# Patient Record
Sex: Female | Born: 1950
Health system: Southern US, Community
[De-identification: ages and names within clinical notes are randomized; demographics above are authoritative.]

## PROBLEM LIST (undated history)

## (undated) DIAGNOSIS — C50919 Malignant neoplasm of unspecified site of unspecified female breast: Secondary | ICD-10-CM

## (undated) DIAGNOSIS — I10 Essential (primary) hypertension: Secondary | ICD-10-CM

## (undated) DIAGNOSIS — M1990 Unspecified osteoarthritis, unspecified site: Secondary | ICD-10-CM

## (undated) DIAGNOSIS — F32A Depression, unspecified: Secondary | ICD-10-CM

## (undated) HISTORY — DX: Essential (primary) hypertension: I10

## (undated) HISTORY — DX: Malignant neoplasm of unspecified site of unspecified female breast: C50.919

## (undated) HISTORY — PX: OTHER SURGICAL HISTORY: SHX169

## (undated) HISTORY — DX: Depression, unspecified: F32.A

## (undated) HISTORY — DX: Unspecified osteoarthritis, unspecified site: M19.90

---

## 1974-04-30 HISTORY — PX: OTHER SURGICAL HISTORY: SHX169

## 2000-03-29 ENCOUNTER — Emergency Department (HOSPITAL_COMMUNITY): Admission: EM | Admit: 2000-03-29 | Discharge: 2000-03-29 | Payer: Self-pay | Admitting: Emergency Medicine

## 2000-03-29 ENCOUNTER — Encounter: Payer: Self-pay | Admitting: Emergency Medicine

## 2003-04-15 ENCOUNTER — Emergency Department (HOSPITAL_COMMUNITY): Admission: EM | Admit: 2003-04-15 | Discharge: 2003-04-15 | Payer: Self-pay | Admitting: Emergency Medicine

## 2005-04-04 ENCOUNTER — Ambulatory Visit (HOSPITAL_BASED_OUTPATIENT_CLINIC_OR_DEPARTMENT_OTHER): Admission: RE | Admit: 2005-04-04 | Discharge: 2005-04-04 | Payer: Self-pay | Admitting: Orthopedic Surgery

## 2005-04-04 ENCOUNTER — Ambulatory Visit (HOSPITAL_COMMUNITY): Admission: RE | Admit: 2005-04-04 | Discharge: 2005-04-04 | Payer: Self-pay | Admitting: Orthopedic Surgery

## 2008-01-10 ENCOUNTER — Emergency Department (HOSPITAL_COMMUNITY): Admission: EM | Admit: 2008-01-10 | Discharge: 2008-01-10 | Payer: Self-pay | Admitting: Emergency Medicine

## 2010-09-15 NOTE — Op Note (Signed)
NAMEWILLIS, KUIPERS                   ACCOUNT NO.:  [id number]   MEDICAL RECORD NO.:  [id number]          PATIENT TYPE:  AMB   LOCATION:  DSC                          FACILITY:  [facility]   PHYSICIAN:  Artist Pais. Weingold, M.D.DATE OF BIRTH:  April 15, 1951   DATE OF PROCEDURE:  04/04/2005  DATE OF DISCHARGE:                                 OPERATIVE REPORT   PREOPERATIVE DIAGNOSIS:  Left wrist chronic pain.   POSTOPERATIVE DIAGNOSIS:  Left wrist chronic pain.   PROCEDURE:  Left wrist arthroscopy with debridement of scapholunate  interosseous ligament tears.   SURGEON:  Artist Pais. Mina Marble, M.D.   ASSISTANT:  None.   ANESTHESIA:  General.   TOURNIQUET TIME:  Thirty-one minutes.   COMPLICATIONS:  No complications.   DRAINS:  No drains.   OPERATIVE REPORT:  The patient was taken to the operating room and after the  induction of adequate general anesthesia, the left upper extremity was  prepped and draped in the usual sterile fashion.  An Esmarch was used to  exsanguinate the limb and tourniquet was inflated to 250 mmHg.  At this  point in time, the patient's left upper extremity was placed on the Concept  wrist traction tower with 12 pounds of countertraction across the  radiocarpal joint.  Once this was done, a 3-4 arthroscopic portal was  established 1 cm distal to Lister's tubercle.  The skin was incised sharply  and blunt dissection was carried down to the capsule.  The scope was  introduced into the joint.  Visualization revealed intact radial-sided  ligaments, a torn scapholunate interosseous ligament and a partially frayed  TFCC.  An 18-gauge needle was used to establish a 6-U outflow portal  followed by a 4-5 working portal established under direct vision.  After  this was done, using a combination of a 2.9 suction shaver and 2.3-mm  Arthrocare wand, the scapholunate interosseous ligament was debrided down to  a stable rim, alternating the instruments between 3-4 and the 4-5  portal.  At the end of the case, 1 mL of Celestone and Marcaine combination, 1:1 of  each, was injected through the 6-U outflow portal after the 4-5 and 3-4  portals were closed with 4-0 nylon.  A sterile dressing of Xeroform, 4 x 4's  and a volar splint were applied.  The patient tolerated the procedure well  and went to the recovery room in stable fashion.     Artist Pais Mina Marble, M.D.  Electronically Signed    MAW/MEDQ  D:  04/05/2005  T:  04/05/2005  Job:  [id number]

## 2011-01-31 LAB — BASIC METABOLIC PANEL
BUN: 11
CO2: 26
Calcium: 10
Chloride: 109
Creatinine, Ser: 0.55
GFR calc Af Amer: >60
GFR calc non Af Amer: >60
Glucose, Bld: 95
Potassium: 4
Sodium: 142

## 2019-03-01 HISTORY — PX: TOTAL HIP ARTHROPLASTY: SHX124

## 2020-02-01 NOTE — Progress Notes (Addendum)
Location of Breast Cancer: UOQ left breast- invasive lobular   Did patient present with symptoms (if so, please note symptoms) or was this found on screening mammography?: Palpable lump. No nipple discharge, no breast distortion.  MRI Breast 01/27/2020: There is a clip in the superficial upper outer left breast anterior depth, presumably at the area of recently diagnosed invasive lobular carcinoma. Minimal associated enhancement.  2.9 cm of contiguous nonmass enhancement in the slightly superior slightly outer left breast anterior depth. This is highly suggestive of malignancy. Mild nipple retraction is likely related to this.  Nothing for malignancy right breast.  Ultrasound 12/24/2019    Diagnostic mammogram 12/24/2019: Suspicious abnormality, 3 x 2 x 4 mm hypoechoic antiparallel rounded mass with angular margins.  Histology per Pathology Report: 01/13/2020   Receptor Status: ER(+ 95%), PR (-), Her2-neu (-), Ki-()   She will have another MRI 10/6 am to determine what was seen on the previous MRI in the posterior breast.  Depending on the results of this MRI she may have ultrasound biopsy in the near future.  She will then discuss with Dr. Laurance Flatten about her treatment plan.  Past/Anticipated interventions by surgeon, if any: Dr.  Laurance Flatten ([place]) 02/01/2020 -Results of MRI discussed. - She will follow-up 02/03/2020 to discuss her treatment plan.  01/19/2020 -Once all the information is available I would like to meet with the patient and family to review the findings and the treatment options.  We will then coordinate care with the oncology department as well as radiation oncology.   Past/Anticipated interventions by medical oncology, if any: Chemotherapy  Dr. Merrie Roof 02/05/2020   Lymphedema issues, if any: No  Pain issues, if any: No  SAFETY ISSUES:  Prior radiation? No  Pacemaker/ICD? No  Possible current pregnancy? Postmenopausal  Is the patient on methotrexate? No  Current Complaints /  other details:      Yolanda Razor, RN 02/01/2020,11:19 AM

## 2020-02-02 ENCOUNTER — Ambulatory Visit
Admission: RE | Admit: 2020-02-02 | Discharge: 2020-02-02 | Disposition: A | Discharge status: Home / Self Care | Payer: No Typology Code available for payment source | Source: Ambulatory Visit | Attending: Radiation Oncology | Admitting: Radiation Oncology

## 2020-02-02 ENCOUNTER — Ambulatory Visit
Admission: RE | Admit: 2020-02-02 | Discharge: 2020-02-02 | Disposition: A | Discharge status: Home / Self Care | Payer: Self-pay | Source: Ambulatory Visit | Attending: Radiation Oncology | Admitting: Radiation Oncology

## 2020-02-02 ENCOUNTER — Encounter: Payer: Self-pay | Admitting: Radiation Oncology

## 2020-02-02 ENCOUNTER — Other Ambulatory Visit: Payer: Self-pay | Admitting: Radiation Oncology

## 2020-02-02 ENCOUNTER — Encounter: Payer: Self-pay | Admitting: Licensed Clinical Social Worker

## 2020-02-02 ENCOUNTER — Other Ambulatory Visit: Payer: Self-pay

## 2020-02-02 VITALS — BP 137/83 | HR 78 | Temp 96.2°F | Resp 20 | Ht 64.5 in | Wt 208.2 lb

## 2020-02-02 DIAGNOSIS — Z800 Family history of malignant neoplasm of digestive organs: Secondary | ICD-10-CM | POA: Diagnosis not present

## 2020-02-02 DIAGNOSIS — F329 Major depressive disorder, single episode, unspecified: Secondary | ICD-10-CM | POA: Diagnosis not present

## 2020-02-02 DIAGNOSIS — Z170 Estrogen receptor positive status [ER+]: Secondary | ICD-10-CM | POA: Insufficient documentation

## 2020-02-02 DIAGNOSIS — C50412 Malignant neoplasm of upper-outer quadrant of left female breast: Secondary | ICD-10-CM | POA: Insufficient documentation

## 2020-02-02 DIAGNOSIS — M129 Arthropathy, unspecified: Secondary | ICD-10-CM | POA: Insufficient documentation

## 2020-02-02 DIAGNOSIS — Z8052 Family history of malignant neoplasm of bladder: Secondary | ICD-10-CM | POA: Diagnosis not present

## 2020-02-02 DIAGNOSIS — C50919 Malignant neoplasm of unspecified site of unspecified female breast: Secondary | ICD-10-CM

## 2020-02-02 DIAGNOSIS — I10 Essential (primary) hypertension: Secondary | ICD-10-CM | POA: Insufficient documentation

## 2020-02-02 DIAGNOSIS — Z806 Family history of leukemia: Secondary | ICD-10-CM | POA: Diagnosis not present

## 2020-02-02 DIAGNOSIS — F17210 Nicotine dependence, cigarettes, uncomplicated: Secondary | ICD-10-CM | POA: Insufficient documentation

## 2020-02-02 DIAGNOSIS — Z79899 Other long term (current) drug therapy: Secondary | ICD-10-CM | POA: Insufficient documentation

## 2020-02-02 NOTE — Progress Notes (Signed)
[place] Psychosocial Distress Screening Clinical Social Work  Clinical Social Work was referred by distress screening protocol.  The patient scored a 10 on the Psychosocial Distress Thermometer which indicates severe distress.  Clinical Social Worker attempted to contact patient by phone to assess for distress and other psychosocial needs.  Called cell & home numbers. No answer, no voicemail available on other phone.  ONCBCN DISTRESS SCREENING 02/02/2020  Screening Type Initial Screening  Distress experienced in past week (1-10) 10  Family Problem type Children  Emotional problem type Nervousness/Anxiety;Adjusting to illness;Isolation/feeling alone;Adjusting to appearance changes  Physical Problem type Sleep/insomnia;Loss of appetitie  Other Contact via phone [phone]    Clinical Social Worker follow up needed: Yes.   If yes, follow up plan: Will make one more attempt to contact patient    Reymundo Winship E, LCSW

## 2020-02-02 NOTE — Progress Notes (Signed)
Radiation Oncology         (336) [phone] ________________________________  Name: Yolanda Alexander        MRN: [id number]  Date of Service: 02/02/2020 DOB: July 20, 1950  [id number], Provider Not In  Carmelia Roller, MD     REFERRING PHYSICIAN: Carmelia Roller, MD   DIAGNOSIS: The encounter diagnosis was Malignant neoplasm of upper-outer quadrant of left breast in female, estrogen receptor positive ([place]).   HISTORY OF PRESENT ILLNESS: Yolanda Alexander is a 69 y.o. female with a new diagnosis of left breast cancer. The patient was noted to have a history of a benign excisional biopsy of the left breast in the 1970s, and recent mass that was palpable to the patient in August 2021.  She underwent diagnostic mammography which revealed a dense oval mass adjacent to the baby from a prior excision, there is also stable postsurgical distortion with skin retraction focally in the superior aspect of the left breast anterior third consistent with scarring.  No visible abnormalities were seen in the right breast.  Targeted ultrasound revealed at the 2 o'clock position a 3 x 2 x 4 mm hypoechoic rounded mass with angular margins she underwent breast biopsy on 01/14/2020 revealed a grade 2 invasive lobular carcinoma with pleomorphic variant.  Her tumor was ER positive, PR negative, HER-2 was negative and Ki-67 was not performed.  She met with Dr. Laurance Flatten and underwent an MRI of the breasts on 01/27/20 which was performed at [facility] and revealed postbiopsy clip present in the superficial upper outer left breast with minimum adjacent enhancement and slightly superior to the biopsy site there was 2.9 cm of non-mass enhancement mild nipple retraction, and no evidence of axillary abnormalities.  No enhancement of the right breast or axilla was identified.  It was recommended that additional ultrasound and possible biopsy or MRI guided biopsy be performed for the nonmasslike enhancement and she is scheduled to have an MRI repeated with  possible biopsy tomorrow.   PREVIOUS RADIATION THERAPY: No   PAST MEDICAL HISTORY:  Past Medical History:  Diagnosis Date  . Arthritis   . Breast cancer ([place])   . Depression   . Hypertension        PAST SURGICAL HISTORY: Past Surgical History:  Procedure Laterality Date  . left breast excisional biopsy Left 1976  . shoulder and hand surgeries    . TOTAL HIP ARTHROPLASTY Right 03/2019     FAMILY HISTORY:  Family History  Problem Relation Age of Onset  . Multiple myeloma Father   . Diabetes Mother   . Stroke Mother   . Cancer Sister        Gallbladder  . Bladder Cancer Brother   . Pancreatic cancer Sister      SOCIAL HISTORY:  reports that she has been smoking cigarettes. She has never used smokeless tobacco. She reports current alcohol use. She reports that she does not use drugs. She is divorced and lives in [place]. Her sister Delcie Roch accompanies her and her three children join [place] by facetime.   ALLERGIES: Citrullus vulgaris, Daucus carota, Orange fruit [citrus], Prunus persica, and Tomato   MEDICATIONS:  Current Outpatient Medications  Medication Sig Dispense Refill  . ascorbic acid (VITAMIN C) 250 MG tablet Take by mouth.    . Cholecalciferol 50 MCG (2000 UT) CAPS Take 1 capsule by mouth daily.    Marland Kitchen FLUoxetine (PROZAC) 10 MG capsule Take by mouth.    . hydrOXYzine (ATARAX/VISTARIL) 10 MG tablet Take by mouth.    Marland Kitchen  Multiple Vitamins-Minerals (ONE-A-DAY WOMENS 50+ ADVANTAGE) TABS Take 1 tablet by mouth daily.    . NON FORMULARY Bedoyecta Capsules- Take 1 capsule by mouth daily.    Marland Kitchen zinc gluconate 50 MG tablet Take 1 tablet by mouth daily.     No current facility-administered medications for this encounter.     REVIEW OF SYSTEMS: On review of systems, the patient reports that she is doing well overall but has had nervousness about her diagnosis. She states it is so confusing and she isn't able to take it all in when she hears cancer. No other symptoms are  noted.    PHYSICAL EXAM:  Wt Readings from Last 3 Encounters:  02/02/20 208 lb 4 oz (94.5 kg)   Temp Readings from Last 3 Encounters:  02/02/20 (!) 96.2 F (35.7 C) (Tympanic)   BP Readings from Last 3 Encounters:  02/02/20 137/83   Pulse Readings from Last 3 Encounters:  02/02/20 78    In general this is a well appearing African American female in no acute distress. She's alert and oriented x4 and appropriate throughout the examination. Cardiopulmonary assessment is negative for acute distress and she exhibits normal effort. Bilateral breast exam is deferred.    ECOG = 1  0 - Asymptomatic (Fully active, able to carry on all predisease activities without restriction)  1 - Symptomatic but completely ambulatory (Restricted in physically strenuous activity but ambulatory and able to carry out work of a light or sedentary nature. For example, light housework, office work)  2 - Symptomatic, <50% in bed during the day (Ambulatory and capable of all self care but unable to carry out any work activities. Up and about more than 50% of waking hours)  3 - Symptomatic, >50% in bed, but not bedbound (Capable of only limited self-care, confined to bed or chair 50% or more of waking hours)  4 - Bedbound (Completely disabled. Cannot carry on any self-care. Totally confined to bed or chair)  5 - Death   Eustace Pen MM, Creech RH, Tormey DC, et al. [phone]). "Toxicity and response criteria of the [facility]". [place] Oncol. 5 (6): 649-55    LABORATORY DATA:  No results found for: WBC, HGB, HCT, MCV, PLT Lab Results  Component Value Date   NA 142 01/10/2008   K 4.0 01/10/2008   CL 109 01/10/2008   CO2 26 01/10/2008   No results found for: ALT, AST, GGT, ALKPHOS, BILITOT    RADIOGRAPHY: No results found.     IMPRESSION/PLAN: 1. Stage IA, cT1aN0M0 grade 2, ER positive invasive lobular carcinoma of the left breast. Dr. Lisbeth Renshaw discusses the pathology findings and  reviews the nature of left breast disease. Dr. Lisbeth Renshaw reviewed the rationale for further biopsies if NME persists which would determine extent of disease. She is then planning on breast conserving surgery with sentinel node biopsy. Dr. Lisbeth Renshaw reviewed the rationale to proceed with adjuvant radiotherapy to the breast followed by antiestrogen therapy. We discussed the risks, benefits, short, and long term effects of radiotherapy, and the patient is interested in proceeding. Dr. Lisbeth Renshaw discusses the delivery and logistics of radiotherapy and anticipates a course of 4 or 6 1/2 weeks of radiotherapy. We will see her back a few weeks after surgery to discuss the simulation process and anticipate we starting radiotherapy about 4-6 weeks after surgery.   In a visit lasting 90 minutes, greater than 50% of the time was spent face to face reviewing her case, as well as in  preparation of, discussing, and coordinating the patient's care.  The above documentation reflects my direct findings during this shared patient visit. Please see the separate note by Dr. Lisbeth Renshaw on this date for the remainder of the patient's plan of care.    Carola Rhine, PAC

## 2020-02-04 ENCOUNTER — Encounter: Payer: Self-pay | Admitting: Licensed Clinical Social Worker

## 2020-02-04 NOTE — Progress Notes (Signed)
[place] Psychosocial Distress Screening Clinical Social Work  [facility] representative made another attempt to reach patient by phone to assess for needs and offer support. No answer, no voicemail available. CSW may be re-consulted as needed in the future.    Glendene Wyer E, LCSW

## 2020-05-31 ENCOUNTER — Encounter: Payer: Self-pay | Admitting: *Deleted

## 2020-06-17 ENCOUNTER — Ambulatory Visit
Admission: RE | Admit: 2020-06-17 | Discharge: 2020-06-17 | Disposition: A | Discharge status: Home / Self Care | Payer: Self-pay | Source: Ambulatory Visit | Attending: Radiation Oncology | Admitting: Radiation Oncology

## 2020-06-17 ENCOUNTER — Inpatient Hospital Stay
Admission: RE | Admit: 2020-06-17 | Discharge: 2020-06-17 | Disposition: A | Discharge status: Home / Self Care | Payer: Self-pay | Source: Ambulatory Visit | Attending: Radiation Oncology | Admitting: Radiation Oncology

## 2020-06-17 ENCOUNTER — Other Ambulatory Visit: Payer: Self-pay | Admitting: Radiation Oncology

## 2020-06-17 DIAGNOSIS — C50919 Malignant neoplasm of unspecified site of unspecified female breast: Secondary | ICD-10-CM

## 2020-07-13 ENCOUNTER — Ambulatory Visit
Admission: RE | Admit: 2020-07-13 | Discharge: 2020-07-13 | Disposition: A | Discharge status: Home / Self Care | Payer: Medicare Other | Source: Ambulatory Visit | Attending: Radiation Oncology | Admitting: Radiation Oncology

## 2020-07-13 ENCOUNTER — Other Ambulatory Visit: Payer: Self-pay

## 2020-07-13 ENCOUNTER — Encounter: Payer: Self-pay | Admitting: Radiation Oncology

## 2020-07-13 VITALS — BP 141/82 | HR 72 | Temp 96.7°F | Resp 18 | Ht 64.5 in

## 2020-07-13 DIAGNOSIS — M129 Arthropathy, unspecified: Secondary | ICD-10-CM | POA: Diagnosis not present

## 2020-07-13 DIAGNOSIS — Z79899 Other long term (current) drug therapy: Secondary | ICD-10-CM | POA: Insufficient documentation

## 2020-07-13 DIAGNOSIS — Z170 Estrogen receptor positive status [ER+]: Secondary | ICD-10-CM | POA: Diagnosis not present

## 2020-07-13 DIAGNOSIS — C50412 Malignant neoplasm of upper-outer quadrant of left female breast: Secondary | ICD-10-CM

## 2020-07-13 DIAGNOSIS — Z800 Family history of malignant neoplasm of digestive organs: Secondary | ICD-10-CM | POA: Diagnosis not present

## 2020-07-13 DIAGNOSIS — Z510 Encounter for antineoplastic radiation therapy: Secondary | ICD-10-CM | POA: Diagnosis not present

## 2020-07-13 DIAGNOSIS — F17210 Nicotine dependence, cigarettes, uncomplicated: Secondary | ICD-10-CM | POA: Diagnosis not present

## 2020-07-13 DIAGNOSIS — I10 Essential (primary) hypertension: Secondary | ICD-10-CM | POA: Diagnosis not present

## 2020-07-13 DIAGNOSIS — Z8052 Family history of malignant neoplasm of bladder: Secondary | ICD-10-CM | POA: Diagnosis not present

## 2020-07-13 NOTE — Progress Notes (Signed)
Radiation Oncology         [phone]) [phone] ________________________________  Name: Yolanda Alexander        MRN: [id number]  Date of Service: 07/13/2020 DOB: 10/09/1950  [id number], Provider Not In  Yolanda Roller, MD     REFERRING PHYSICIAN: Carmelia Roller, MD   DIAGNOSIS: The encounter diagnosis was Malignant neoplasm of upper-outer quadrant of left breast in female, estrogen receptor positive ([place]).   HISTORY OF PRESENT ILLNESS: Yolanda Alexander is a 70 y.o. female with a  diagnosis of left breast cancer. The patient was noted to have a history of a benign excisional biopsy of the left breast in the 1970s, and recent mass that was palpable to the patient in August 2021.  She underwent diagnostic mammography which revealed a dense oval mass adjacent to the baby from a prior excision, there is also stable postsurgical distortion with skin retraction focally in the superior aspect of the left breast anterior third consistent with scarring.  No visible abnormalities were seen in the right breast.  Targeted ultrasound revealed at the 2 o'clock position a 3 x 2 x 4 mm hypoechoic rounded mass with angular margins she underwent breast biopsy on 01/14/2020 revealed a grade 2 invasive lobular carcinoma with pleomorphic variant.  Her tumor was ER positive, PR negative, HER-2 was negative and Ki-67 was not performed.  She met with Dr. Laurance Flatten and underwent an MRI of the breasts on 01/27/20 which was performed at [facility] and revealed postbiopsy clip present in the superficial upper outer left breast with minimum adjacent enhancement and slightly superior to the biopsy site there was 2.9 cm of non-mass enhancement mild nipple retraction, and no evidence of axillary abnormalities.  No enhancement of the right breast or axilla was identified.  Since her last visit the patient she elected to forego plans for breast conserving surgery and rather proceed with a mastectomy and reconstruction.  She met with Dr. Morley Kos and agreed to  left tissue rearrangement of chest/axilla and closure of the left chest wound she underwent this procedure on 04/26/2020.  Final pathology revealed 4 - sentinel lymph nodes, her mastectomy specimen revealed an invasive lobular carcinoma grade 2 with multifocal disease the largest site measuring 2.3 cm with associated LCIS.  Her tumor focally involve the anterior margin.  Additional tissue was taken from the mastectomy flap in order to complete her reconstruction however it was unoriented and measured 2.9 cm x 1 cm excised to a depth of 1.8 cm the epidermal surface contains a full-thickness disruption of 1.2 cm 5.5 cm.  Given the concerns for the positive margin, she is seen to discuss postmastectomy radiotherapy.  The patient is also aware that her tissue was sent for Oncotype testing and is low risk with a score of 18 so no subsequent chemotherapy has been recommended.    PREVIOUS RADIATION THERAPY: No   PAST MEDICAL HISTORY:  Past Medical History:  Diagnosis Date  . Arthritis   . Breast cancer ([place])   . Depression   . Hypertension        PAST SURGICAL HISTORY: Past Surgical History:  Procedure Laterality Date  . left breast excisional biopsy Left 1976  . shoulder and hand surgeries    . TOTAL HIP ARTHROPLASTY Right 03/2019     FAMILY HISTORY:  Family History  Problem Relation Age of Onset  . Multiple myeloma Father   . Diabetes Mother   . Stroke Mother   . Cancer Sister  Gallbladder  . Bladder Cancer Brother   . Pancreatic cancer Sister      SOCIAL HISTORY:  reports that she has been smoking cigarettes. She has never used smokeless tobacco. She reports current alcohol use. She reports that she does not use drugs. She is divorced and lives in [place]. Her sister Yolanda Alexander accompanies her.  ALLERGIES: Citrullus vulgaris, Daucus carota, Orange fruit [citrus], Prunus persica, and Tomato   MEDICATIONS:  Current Outpatient Medications  Medication Sig Dispense Refill  .  ascorbic acid (VITAMIN C) 250 MG tablet Take by mouth.    . Cholecalciferol 50 MCG (2000 UT) CAPS Take 1 capsule by mouth daily.    . Flaxseed, Linseed, (FLAXSEED OIL PO) Take by mouth.    Marland Kitchen FLUoxetine (PROZAC) 10 MG capsule Take by mouth.    . hydrOXYzine (ATARAX/VISTARIL) 10 MG tablet Take by mouth.    . Multiple Vitamins-Minerals (ONE-A-DAY WOMENS 50+ ADVANTAGE) TABS Take 1 tablet by mouth daily.    . NON FORMULARY Bedoyecta Capsules- Take 1 capsule by mouth daily.    Marland Kitchen zinc gluconate 50 MG tablet Take 1 tablet by mouth daily.     No current facility-administered medications for this encounter.     REVIEW OF SYSTEMS: On review of systems, the patient reports that she is doing well overall since her surgery and has been sore since her recent skin revision with Dr. Morley Kos in his clinic. The incision however is not open or irritated.     PHYSICAL EXAM:  Wt Readings from Last 3 Encounters:  02/02/20 208 lb 4 oz (94.5 kg)   Temp Readings from Last 3 Encounters:  07/13/20 (!) 96.7 F (35.9 C) (Temporal)  02/02/20 (!) 96.2 F (35.7 C) (Tympanic)   BP Readings from Last 3 Encounters:  07/13/20 (!) 141/82  02/02/20 137/83   Pulse Readings from Last 3 Encounters:  07/13/20 72  02/02/20 78    In general this is a well appearing African American female in no acute distress. She's alert and oriented x4 and appropriate throughout the examination. Cardiopulmonary assessment is negative for acute distress and she exhibits normal effort. Bilateral breast exam is deferred.    ECOG = 1  0 - Asymptomatic (Fully active, able to carry on all predisease activities without restriction)  1 - Symptomatic but completely ambulatory (Restricted in physically strenuous activity but ambulatory and able to carry out work of a light or sedentary nature. For example, light housework, office work)  2 - Symptomatic, <50% in bed during the day (Ambulatory and capable of all self care but unable to carry  out any work activities. Up and about more than 50% of waking hours)  3 - Symptomatic, >50% in bed, but not bedbound (Capable of only limited self-care, confined to bed or chair 50% or more of waking hours)  4 - Bedbound (Completely disabled. Cannot carry on any self-care. Totally confined to bed or chair)  5 - Death   Eustace Pen MM, Creech RH, Tormey DC, et al. [phone]). "Toxicity and response criteria of the [facility]". [place] Oncol. 5 (6): 649-55    LABORATORY DATA:  No results found for: WBC, HGB, HCT, MCV, PLT Lab Results  Component Value Date   NA 142 01/10/2008   K 4.0 01/10/2008   CL 109 01/10/2008   CO2 26 01/10/2008   No results found for: ALT, AST, GGT, ALKPHOS, BILITOT    RADIOGRAPHY: No results found.     IMPRESSION/PLAN: 1. Stage IIA, pT2N0M0  grade 2, ER positive invasive lobular carcinoma of the left breast. Dr. Lisbeth Renshaw discusses the final pathology findings and reviews the nature of left breast disease and discusses the risks of recurrence given her positive anterior margin. Dr. Lisbeth Renshaw reviewed the rationale to proceed with adjuvant radiotherapy to the chest wall and regional nodes followed by antiestrogen therapy. We discussed the risks, benefits, short, and long term effects of radiotherapy, and the patient is interested in proceeding. Dr. Lisbeth Renshaw discusses the delivery and logistics of radiotherapy and anticipates a course of 6 1/2 weeks of radiotherapy to the left chest wall without with deep inspiration breath hold technique. Written consent is obtained and placed in the chart, a copy was provided to the patient. She will simulate this morning.  In a visit lasting 45 minutes, greater than 50% of the time was spent face to face reviewing her case, as well as in preparation of, discussing, and coordinating the patient's care.  The above documentation reflects my direct findings during this shared patient visit. Please see the separate note by Dr.  Lisbeth Renshaw on this date for the remainder of the patient's plan of care.    Carola Rhine, PAC

## 2020-07-19 DIAGNOSIS — C50412 Malignant neoplasm of upper-outer quadrant of left female breast: Secondary | ICD-10-CM | POA: Diagnosis not present

## 2020-07-21 ENCOUNTER — Ambulatory Visit
Admission: RE | Admit: 2020-07-21 | Discharge: 2020-07-21 | Disposition: A | Discharge status: Home / Self Care | Payer: Medicare Other | Source: Ambulatory Visit | Attending: Radiation Oncology | Admitting: Radiation Oncology

## 2020-07-21 ENCOUNTER — Other Ambulatory Visit: Payer: Self-pay

## 2020-07-21 DIAGNOSIS — C50412 Malignant neoplasm of upper-outer quadrant of left female breast: Secondary | ICD-10-CM | POA: Diagnosis not present

## 2020-07-21 NOTE — Progress Notes (Signed)
Pt here for patient teaching.  Pt given Radiation and You booklet, skin care instructions, Alra deodorant and Radiaplex gel.  Reviewed areas of pertinence such as fatigue, hair loss, skin changes, breast tenderness and breast swelling . Pt able to give teach back of to pat skin and use unscented/gentle soap,apply Radiaplex bid, avoid applying anything to skin within 4 hours of treatment, avoid wearing an under wire bra and to use an electric razor if they must shave. Pt verbalizes understanding of information given and will contact nursing with any questions or concerns.     Kyliana Standen M. Mckinley Olheiser RN, BSN      

## 2020-07-22 ENCOUNTER — Ambulatory Visit
Admission: RE | Admit: 2020-07-22 | Discharge: 2020-07-22 | Disposition: A | Discharge status: Home / Self Care | Payer: Medicare Other | Source: Ambulatory Visit | Attending: Radiation Oncology | Admitting: Radiation Oncology

## 2020-07-22 DIAGNOSIS — C50412 Malignant neoplasm of upper-outer quadrant of left female breast: Secondary | ICD-10-CM

## 2020-07-22 DIAGNOSIS — Z170 Estrogen receptor positive status [ER+]: Secondary | ICD-10-CM

## 2020-07-22 MED ORDER — ALRA NON-METALLIC DEODORANT (RAD-ONC)
1.0000 "application " | Freq: Once | TOPICAL | Status: AC
  Administered 2020-07-22: 1 via TOPICAL

## 2020-07-22 MED ORDER — RADIAPLEXRX EX GEL: Freq: Once | CUTANEOUS | Status: AC

## 2020-07-25 ENCOUNTER — Other Ambulatory Visit: Payer: Self-pay

## 2020-07-25 ENCOUNTER — Ambulatory Visit
Admission: RE | Admit: 2020-07-25 | Discharge: 2020-07-25 | Disposition: A | Discharge status: Home / Self Care | Payer: Medicare Other | Source: Ambulatory Visit | Attending: Radiation Oncology | Admitting: Radiation Oncology

## 2020-07-25 DIAGNOSIS — C50412 Malignant neoplasm of upper-outer quadrant of left female breast: Secondary | ICD-10-CM | POA: Diagnosis not present

## 2020-07-26 ENCOUNTER — Other Ambulatory Visit: Payer: Self-pay

## 2020-07-26 ENCOUNTER — Ambulatory Visit
Admission: RE | Admit: 2020-07-26 | Discharge: 2020-07-26 | Disposition: A | Discharge status: Home / Self Care | Payer: Medicare Other | Source: Ambulatory Visit | Attending: Radiation Oncology | Admitting: Radiation Oncology

## 2020-07-26 DIAGNOSIS — C50412 Malignant neoplasm of upper-outer quadrant of left female breast: Secondary | ICD-10-CM | POA: Diagnosis not present

## 2020-07-27 ENCOUNTER — Ambulatory Visit
Admission: RE | Admit: 2020-07-27 | Discharge: 2020-07-27 | Disposition: A | Discharge status: Home / Self Care | Payer: Medicare Other | Source: Ambulatory Visit | Attending: Radiation Oncology | Admitting: Radiation Oncology

## 2020-07-27 DIAGNOSIS — C50412 Malignant neoplasm of upper-outer quadrant of left female breast: Secondary | ICD-10-CM | POA: Diagnosis not present

## 2020-07-28 ENCOUNTER — Other Ambulatory Visit: Payer: Self-pay

## 2020-07-28 ENCOUNTER — Ambulatory Visit
Admission: RE | Admit: 2020-07-28 | Discharge: 2020-07-28 | Disposition: A | Discharge status: Home / Self Care | Payer: Medicare Other | Source: Ambulatory Visit | Attending: Radiation Oncology | Admitting: Radiation Oncology

## 2020-07-28 DIAGNOSIS — C50412 Malignant neoplasm of upper-outer quadrant of left female breast: Secondary | ICD-10-CM | POA: Diagnosis not present

## 2020-07-29 ENCOUNTER — Ambulatory Visit
Admission: RE | Admit: 2020-07-29 | Discharge: 2020-07-29 | Disposition: A | Discharge status: Home / Self Care | Payer: No Typology Code available for payment source | Source: Ambulatory Visit | Attending: Radiation Oncology | Admitting: Radiation Oncology

## 2020-07-29 DIAGNOSIS — Z170 Estrogen receptor positive status [ER+]: Secondary | ICD-10-CM | POA: Diagnosis not present

## 2020-07-29 DIAGNOSIS — Z510 Encounter for antineoplastic radiation therapy: Secondary | ICD-10-CM | POA: Insufficient documentation

## 2020-07-29 DIAGNOSIS — C50412 Malignant neoplasm of upper-outer quadrant of left female breast: Secondary | ICD-10-CM | POA: Diagnosis not present

## 2020-08-01 ENCOUNTER — Ambulatory Visit
Admission: RE | Admit: 2020-08-01 | Discharge: 2020-08-01 | Disposition: A | Discharge status: Home / Self Care | Payer: No Typology Code available for payment source | Source: Ambulatory Visit | Attending: Radiation Oncology | Admitting: Radiation Oncology

## 2020-08-01 ENCOUNTER — Other Ambulatory Visit: Payer: Self-pay

## 2020-08-01 DIAGNOSIS — Z170 Estrogen receptor positive status [ER+]: Secondary | ICD-10-CM | POA: Diagnosis not present

## 2020-08-01 DIAGNOSIS — C50412 Malignant neoplasm of upper-outer quadrant of left female breast: Secondary | ICD-10-CM | POA: Diagnosis not present

## 2020-08-01 DIAGNOSIS — Z510 Encounter for antineoplastic radiation therapy: Secondary | ICD-10-CM | POA: Diagnosis not present

## 2020-08-02 ENCOUNTER — Ambulatory Visit
Admission: RE | Admit: 2020-08-02 | Discharge: 2020-08-02 | Disposition: A | Discharge status: Home / Self Care | Payer: No Typology Code available for payment source | Source: Ambulatory Visit | Attending: Radiation Oncology | Admitting: Radiation Oncology

## 2020-08-02 DIAGNOSIS — Z510 Encounter for antineoplastic radiation therapy: Secondary | ICD-10-CM | POA: Diagnosis not present

## 2020-08-02 DIAGNOSIS — Z170 Estrogen receptor positive status [ER+]: Secondary | ICD-10-CM | POA: Diagnosis not present

## 2020-08-02 DIAGNOSIS — C50412 Malignant neoplasm of upper-outer quadrant of left female breast: Secondary | ICD-10-CM | POA: Diagnosis not present

## 2020-08-03 ENCOUNTER — Ambulatory Visit
Admission: RE | Admit: 2020-08-03 | Discharge: 2020-08-03 | Disposition: A | Discharge status: Home / Self Care | Payer: No Typology Code available for payment source | Source: Ambulatory Visit | Attending: Radiation Oncology | Admitting: Radiation Oncology

## 2020-08-03 DIAGNOSIS — Z510 Encounter for antineoplastic radiation therapy: Secondary | ICD-10-CM | POA: Diagnosis not present

## 2020-08-03 DIAGNOSIS — Z170 Estrogen receptor positive status [ER+]: Secondary | ICD-10-CM | POA: Diagnosis not present

## 2020-08-03 DIAGNOSIS — C50412 Malignant neoplasm of upper-outer quadrant of left female breast: Secondary | ICD-10-CM | POA: Diagnosis not present

## 2020-08-04 ENCOUNTER — Ambulatory Visit
Admission: RE | Admit: 2020-08-04 | Discharge: 2020-08-04 | Disposition: A | Discharge status: Home / Self Care | Payer: No Typology Code available for payment source | Source: Ambulatory Visit | Attending: Radiation Oncology | Admitting: Radiation Oncology

## 2020-08-04 ENCOUNTER — Other Ambulatory Visit: Payer: Self-pay

## 2020-08-04 DIAGNOSIS — Z510 Encounter for antineoplastic radiation therapy: Secondary | ICD-10-CM | POA: Diagnosis not present

## 2020-08-04 DIAGNOSIS — Z170 Estrogen receptor positive status [ER+]: Secondary | ICD-10-CM | POA: Diagnosis not present

## 2020-08-04 DIAGNOSIS — C50412 Malignant neoplasm of upper-outer quadrant of left female breast: Secondary | ICD-10-CM | POA: Diagnosis not present

## 2020-08-05 ENCOUNTER — Ambulatory Visit
Admission: RE | Admit: 2020-08-05 | Discharge: 2020-08-05 | Disposition: A | Discharge status: Home / Self Care | Payer: No Typology Code available for payment source | Source: Ambulatory Visit | Attending: Radiation Oncology | Admitting: Radiation Oncology

## 2020-08-05 DIAGNOSIS — Z170 Estrogen receptor positive status [ER+]: Secondary | ICD-10-CM | POA: Diagnosis not present

## 2020-08-05 DIAGNOSIS — Z510 Encounter for antineoplastic radiation therapy: Secondary | ICD-10-CM | POA: Diagnosis not present

## 2020-08-05 DIAGNOSIS — C50412 Malignant neoplasm of upper-outer quadrant of left female breast: Secondary | ICD-10-CM | POA: Diagnosis not present

## 2020-08-08 ENCOUNTER — Ambulatory Visit
Admission: RE | Admit: 2020-08-08 | Discharge: 2020-08-08 | Disposition: A | Discharge status: Home / Self Care | Payer: No Typology Code available for payment source | Source: Ambulatory Visit | Attending: Radiation Oncology | Admitting: Radiation Oncology

## 2020-08-08 ENCOUNTER — Other Ambulatory Visit: Payer: Self-pay

## 2020-08-08 DIAGNOSIS — Z170 Estrogen receptor positive status [ER+]: Secondary | ICD-10-CM | POA: Diagnosis not present

## 2020-08-08 DIAGNOSIS — C50412 Malignant neoplasm of upper-outer quadrant of left female breast: Secondary | ICD-10-CM | POA: Diagnosis not present

## 2020-08-08 DIAGNOSIS — Z510 Encounter for antineoplastic radiation therapy: Secondary | ICD-10-CM | POA: Diagnosis not present

## 2020-08-09 ENCOUNTER — Ambulatory Visit
Admission: RE | Admit: 2020-08-09 | Discharge: 2020-08-09 | Disposition: A | Discharge status: Home / Self Care | Payer: No Typology Code available for payment source | Source: Ambulatory Visit | Attending: Radiation Oncology | Admitting: Radiation Oncology

## 2020-08-09 DIAGNOSIS — Z510 Encounter for antineoplastic radiation therapy: Secondary | ICD-10-CM | POA: Diagnosis not present

## 2020-08-09 DIAGNOSIS — C50412 Malignant neoplasm of upper-outer quadrant of left female breast: Secondary | ICD-10-CM | POA: Diagnosis not present

## 2020-08-09 DIAGNOSIS — Z170 Estrogen receptor positive status [ER+]: Secondary | ICD-10-CM | POA: Diagnosis not present

## 2020-08-10 ENCOUNTER — Other Ambulatory Visit: Payer: Self-pay

## 2020-08-10 ENCOUNTER — Ambulatory Visit
Admission: RE | Admit: 2020-08-10 | Discharge: 2020-08-10 | Disposition: A | Discharge status: Home / Self Care | Payer: No Typology Code available for payment source | Source: Ambulatory Visit | Attending: Radiation Oncology | Admitting: Radiation Oncology

## 2020-08-10 DIAGNOSIS — Z170 Estrogen receptor positive status [ER+]: Secondary | ICD-10-CM | POA: Diagnosis not present

## 2020-08-10 DIAGNOSIS — C50412 Malignant neoplasm of upper-outer quadrant of left female breast: Secondary | ICD-10-CM | POA: Diagnosis not present

## 2020-08-10 DIAGNOSIS — Z510 Encounter for antineoplastic radiation therapy: Secondary | ICD-10-CM | POA: Diagnosis not present

## 2020-08-11 ENCOUNTER — Ambulatory Visit
Admission: RE | Admit: 2020-08-11 | Discharge: 2020-08-11 | Disposition: A | Discharge status: Home / Self Care | Payer: No Typology Code available for payment source | Source: Ambulatory Visit | Attending: Radiation Oncology | Admitting: Radiation Oncology

## 2020-08-11 DIAGNOSIS — Z170 Estrogen receptor positive status [ER+]: Secondary | ICD-10-CM | POA: Diagnosis not present

## 2020-08-11 DIAGNOSIS — Z510 Encounter for antineoplastic radiation therapy: Secondary | ICD-10-CM | POA: Diagnosis not present

## 2020-08-11 DIAGNOSIS — C50412 Malignant neoplasm of upper-outer quadrant of left female breast: Secondary | ICD-10-CM | POA: Diagnosis not present

## 2020-08-12 ENCOUNTER — Encounter: Payer: Self-pay | Admitting: Radiology

## 2020-08-12 ENCOUNTER — Ambulatory Visit
Admission: RE | Admit: 2020-08-12 | Discharge: 2020-08-12 | Disposition: A | Discharge status: Home / Self Care | Payer: No Typology Code available for payment source | Source: Ambulatory Visit | Attending: Radiation Oncology | Admitting: Radiation Oncology

## 2020-08-12 ENCOUNTER — Other Ambulatory Visit: Payer: Self-pay

## 2020-08-12 DIAGNOSIS — Z170 Estrogen receptor positive status [ER+]: Secondary | ICD-10-CM | POA: Diagnosis not present

## 2020-08-12 DIAGNOSIS — C50412 Malignant neoplasm of upper-outer quadrant of left female breast: Secondary | ICD-10-CM | POA: Diagnosis not present

## 2020-08-12 DIAGNOSIS — Z510 Encounter for antineoplastic radiation therapy: Secondary | ICD-10-CM | POA: Diagnosis not present

## 2020-08-15 ENCOUNTER — Ambulatory Visit
Admission: RE | Admit: 2020-08-15 | Discharge: 2020-08-15 | Disposition: A | Discharge status: Home / Self Care | Payer: No Typology Code available for payment source | Source: Ambulatory Visit | Attending: Radiation Oncology | Admitting: Radiation Oncology

## 2020-08-15 ENCOUNTER — Other Ambulatory Visit: Payer: Self-pay

## 2020-08-15 DIAGNOSIS — Z170 Estrogen receptor positive status [ER+]: Secondary | ICD-10-CM | POA: Diagnosis not present

## 2020-08-15 DIAGNOSIS — Z510 Encounter for antineoplastic radiation therapy: Secondary | ICD-10-CM | POA: Diagnosis not present

## 2020-08-15 DIAGNOSIS — C50412 Malignant neoplasm of upper-outer quadrant of left female breast: Secondary | ICD-10-CM | POA: Diagnosis not present

## 2020-08-16 ENCOUNTER — Ambulatory Visit
Admission: RE | Admit: 2020-08-16 | Discharge: 2020-08-16 | Disposition: A | Discharge status: Home / Self Care | Payer: No Typology Code available for payment source | Source: Ambulatory Visit | Attending: Radiation Oncology | Admitting: Radiation Oncology

## 2020-08-16 DIAGNOSIS — C50412 Malignant neoplasm of upper-outer quadrant of left female breast: Secondary | ICD-10-CM | POA: Diagnosis not present

## 2020-08-16 DIAGNOSIS — Z510 Encounter for antineoplastic radiation therapy: Secondary | ICD-10-CM | POA: Diagnosis not present

## 2020-08-16 DIAGNOSIS — Z170 Estrogen receptor positive status [ER+]: Secondary | ICD-10-CM | POA: Diagnosis not present

## 2020-08-17 ENCOUNTER — Ambulatory Visit
Admission: RE | Admit: 2020-08-17 | Discharge: 2020-08-17 | Disposition: A | Discharge status: Home / Self Care | Payer: No Typology Code available for payment source | Source: Ambulatory Visit | Attending: Radiation Oncology | Admitting: Radiation Oncology

## 2020-08-17 ENCOUNTER — Other Ambulatory Visit: Payer: Self-pay

## 2020-08-17 DIAGNOSIS — C50412 Malignant neoplasm of upper-outer quadrant of left female breast: Secondary | ICD-10-CM | POA: Diagnosis not present

## 2020-08-17 DIAGNOSIS — Z170 Estrogen receptor positive status [ER+]: Secondary | ICD-10-CM | POA: Diagnosis not present

## 2020-08-17 DIAGNOSIS — Z510 Encounter for antineoplastic radiation therapy: Secondary | ICD-10-CM | POA: Diagnosis not present

## 2020-08-18 ENCOUNTER — Ambulatory Visit
Admission: RE | Admit: 2020-08-18 | Discharge: 2020-08-18 | Disposition: A | Discharge status: Home / Self Care | Payer: No Typology Code available for payment source | Source: Ambulatory Visit | Attending: Radiation Oncology | Admitting: Radiation Oncology

## 2020-08-18 DIAGNOSIS — Z170 Estrogen receptor positive status [ER+]: Secondary | ICD-10-CM | POA: Diagnosis not present

## 2020-08-18 DIAGNOSIS — Z510 Encounter for antineoplastic radiation therapy: Secondary | ICD-10-CM | POA: Diagnosis not present

## 2020-08-18 DIAGNOSIS — C50412 Malignant neoplasm of upper-outer quadrant of left female breast: Secondary | ICD-10-CM | POA: Diagnosis not present

## 2020-08-19 ENCOUNTER — Other Ambulatory Visit: Payer: Self-pay

## 2020-08-19 ENCOUNTER — Ambulatory Visit
Admission: RE | Admit: 2020-08-19 | Discharge: 2020-08-19 | Disposition: A | Discharge status: Home / Self Care | Payer: No Typology Code available for payment source | Source: Ambulatory Visit | Attending: Radiation Oncology | Admitting: Radiation Oncology

## 2020-08-19 ENCOUNTER — Ambulatory Visit: Payer: No Typology Code available for payment source | Admitting: Radiation Oncology

## 2020-08-19 DIAGNOSIS — C50412 Malignant neoplasm of upper-outer quadrant of left female breast: Secondary | ICD-10-CM | POA: Diagnosis not present

## 2020-08-19 DIAGNOSIS — Z170 Estrogen receptor positive status [ER+]: Secondary | ICD-10-CM | POA: Diagnosis not present

## 2020-08-19 DIAGNOSIS — Z510 Encounter for antineoplastic radiation therapy: Secondary | ICD-10-CM | POA: Diagnosis not present

## 2020-08-19 MED ORDER — RADIAPLEXRX EX GEL: Freq: Once | CUTANEOUS | Status: AC

## 2020-08-22 ENCOUNTER — Other Ambulatory Visit: Payer: Self-pay

## 2020-08-22 ENCOUNTER — Ambulatory Visit
Admission: RE | Admit: 2020-08-22 | Discharge: 2020-08-22 | Disposition: A | Discharge status: Home / Self Care | Payer: No Typology Code available for payment source | Source: Ambulatory Visit | Attending: Radiation Oncology | Admitting: Radiation Oncology

## 2020-08-22 DIAGNOSIS — Z510 Encounter for antineoplastic radiation therapy: Secondary | ICD-10-CM | POA: Diagnosis not present

## 2020-08-22 DIAGNOSIS — Z170 Estrogen receptor positive status [ER+]: Secondary | ICD-10-CM | POA: Diagnosis not present

## 2020-08-22 DIAGNOSIS — C50412 Malignant neoplasm of upper-outer quadrant of left female breast: Secondary | ICD-10-CM | POA: Diagnosis not present

## 2020-08-23 ENCOUNTER — Ambulatory Visit
Admission: RE | Admit: 2020-08-23 | Discharge: 2020-08-23 | Disposition: A | Discharge status: Home / Self Care | Payer: No Typology Code available for payment source | Source: Ambulatory Visit | Attending: Radiation Oncology | Admitting: Radiation Oncology

## 2020-08-23 DIAGNOSIS — C50412 Malignant neoplasm of upper-outer quadrant of left female breast: Secondary | ICD-10-CM | POA: Diagnosis not present

## 2020-08-23 DIAGNOSIS — Z510 Encounter for antineoplastic radiation therapy: Secondary | ICD-10-CM | POA: Diagnosis not present

## 2020-08-23 DIAGNOSIS — Z170 Estrogen receptor positive status [ER+]: Secondary | ICD-10-CM | POA: Diagnosis not present

## 2020-08-24 ENCOUNTER — Ambulatory Visit
Admission: RE | Admit: 2020-08-24 | Discharge: 2020-08-24 | Disposition: A | Discharge status: Home / Self Care | Payer: No Typology Code available for payment source | Source: Ambulatory Visit | Attending: Radiation Oncology | Admitting: Radiation Oncology

## 2020-08-24 ENCOUNTER — Other Ambulatory Visit: Payer: Self-pay

## 2020-08-24 DIAGNOSIS — Z170 Estrogen receptor positive status [ER+]: Secondary | ICD-10-CM | POA: Diagnosis not present

## 2020-08-24 DIAGNOSIS — Z510 Encounter for antineoplastic radiation therapy: Secondary | ICD-10-CM | POA: Diagnosis not present

## 2020-08-24 DIAGNOSIS — C50412 Malignant neoplasm of upper-outer quadrant of left female breast: Secondary | ICD-10-CM | POA: Diagnosis not present

## 2020-08-25 ENCOUNTER — Ambulatory Visit
Admission: RE | Admit: 2020-08-25 | Discharge: 2020-08-25 | Disposition: A | Discharge status: Home / Self Care | Payer: No Typology Code available for payment source | Source: Ambulatory Visit | Attending: Radiation Oncology | Admitting: Radiation Oncology

## 2020-08-25 DIAGNOSIS — Z170 Estrogen receptor positive status [ER+]: Secondary | ICD-10-CM | POA: Diagnosis not present

## 2020-08-25 DIAGNOSIS — Z510 Encounter for antineoplastic radiation therapy: Secondary | ICD-10-CM | POA: Diagnosis not present

## 2020-08-25 DIAGNOSIS — C50412 Malignant neoplasm of upper-outer quadrant of left female breast: Secondary | ICD-10-CM | POA: Diagnosis not present

## 2020-08-26 ENCOUNTER — Ambulatory Visit: Payer: No Typology Code available for payment source | Admitting: Radiation Oncology

## 2020-08-26 ENCOUNTER — Other Ambulatory Visit: Payer: Self-pay

## 2020-08-26 ENCOUNTER — Ambulatory Visit
Admission: RE | Admit: 2020-08-26 | Discharge: 2020-08-26 | Disposition: A | Discharge status: Home / Self Care | Payer: No Typology Code available for payment source | Source: Ambulatory Visit | Attending: Radiation Oncology | Admitting: Radiation Oncology

## 2020-08-26 DIAGNOSIS — Z510 Encounter for antineoplastic radiation therapy: Secondary | ICD-10-CM | POA: Diagnosis not present

## 2020-08-26 DIAGNOSIS — Z170 Estrogen receptor positive status [ER+]: Secondary | ICD-10-CM | POA: Diagnosis not present

## 2020-08-26 DIAGNOSIS — C50412 Malignant neoplasm of upper-outer quadrant of left female breast: Secondary | ICD-10-CM | POA: Diagnosis not present

## 2020-08-29 ENCOUNTER — Other Ambulatory Visit: Payer: Self-pay

## 2020-08-29 ENCOUNTER — Ambulatory Visit
Admission: RE | Admit: 2020-08-29 | Discharge: 2020-08-29 | Disposition: A | Discharge status: Home / Self Care | Payer: No Typology Code available for payment source | Source: Ambulatory Visit | Attending: Radiation Oncology | Admitting: Radiation Oncology

## 2020-08-29 DIAGNOSIS — Z510 Encounter for antineoplastic radiation therapy: Secondary | ICD-10-CM | POA: Insufficient documentation

## 2020-08-29 DIAGNOSIS — C50412 Malignant neoplasm of upper-outer quadrant of left female breast: Secondary | ICD-10-CM | POA: Diagnosis not present

## 2020-08-29 DIAGNOSIS — Z170 Estrogen receptor positive status [ER+]: Secondary | ICD-10-CM | POA: Insufficient documentation

## 2020-08-30 ENCOUNTER — Ambulatory Visit
Admission: RE | Admit: 2020-08-30 | Discharge: 2020-08-30 | Disposition: A | Discharge status: Home / Self Care | Payer: No Typology Code available for payment source | Source: Ambulatory Visit | Attending: Radiation Oncology | Admitting: Radiation Oncology

## 2020-08-30 DIAGNOSIS — Z510 Encounter for antineoplastic radiation therapy: Secondary | ICD-10-CM | POA: Diagnosis not present

## 2020-08-31 ENCOUNTER — Other Ambulatory Visit: Payer: Self-pay

## 2020-08-31 ENCOUNTER — Ambulatory Visit
Admission: RE | Admit: 2020-08-31 | Discharge: 2020-08-31 | Disposition: A | Discharge status: Home / Self Care | Payer: No Typology Code available for payment source | Source: Ambulatory Visit | Attending: Radiation Oncology | Admitting: Radiation Oncology

## 2020-08-31 DIAGNOSIS — Z510 Encounter for antineoplastic radiation therapy: Secondary | ICD-10-CM | POA: Diagnosis not present

## 2020-09-01 ENCOUNTER — Ambulatory Visit
Admission: RE | Admit: 2020-09-01 | Discharge: 2020-09-01 | Disposition: A | Discharge status: Home / Self Care | Payer: No Typology Code available for payment source | Source: Ambulatory Visit | Attending: Radiation Oncology | Admitting: Radiation Oncology

## 2020-09-01 DIAGNOSIS — Z510 Encounter for antineoplastic radiation therapy: Secondary | ICD-10-CM | POA: Diagnosis not present

## 2020-09-02 ENCOUNTER — Ambulatory Visit: Payer: No Typology Code available for payment source

## 2020-09-05 ENCOUNTER — Other Ambulatory Visit: Payer: Self-pay

## 2020-09-05 ENCOUNTER — Ambulatory Visit: Payer: No Typology Code available for payment source

## 2020-09-05 ENCOUNTER — Ambulatory Visit
Admission: RE | Admit: 2020-09-05 | Discharge: 2020-09-05 | Disposition: A | Discharge status: Home / Self Care | Payer: No Typology Code available for payment source | Source: Ambulatory Visit | Attending: Radiation Oncology | Admitting: Radiation Oncology

## 2020-09-05 DIAGNOSIS — Z510 Encounter for antineoplastic radiation therapy: Secondary | ICD-10-CM | POA: Diagnosis not present

## 2020-09-06 ENCOUNTER — Encounter: Payer: Self-pay | Admitting: Radiation Oncology

## 2020-09-06 ENCOUNTER — Ambulatory Visit
Admission: RE | Admit: 2020-09-06 | Discharge: 2020-09-06 | Disposition: A | Discharge status: Home / Self Care | Payer: No Typology Code available for payment source | Source: Ambulatory Visit | Attending: Radiation Oncology | Admitting: Radiation Oncology

## 2020-09-06 DIAGNOSIS — Z510 Encounter for antineoplastic radiation therapy: Secondary | ICD-10-CM | POA: Diagnosis not present

## 2020-09-06 DIAGNOSIS — Z170 Estrogen receptor positive status [ER+]: Secondary | ICD-10-CM

## 2020-09-06 DIAGNOSIS — C50412 Malignant neoplasm of upper-outer quadrant of left female breast: Secondary | ICD-10-CM

## 2020-09-06 MED ORDER — RADIAPLEXRX EX GEL: Freq: Once | CUTANEOUS | Status: AC

## 2020-09-13 NOTE — Progress Notes (Signed)
  Patient Name: Yolanda Alexander MRN: [id number] DOB: 06-07-1950 Referring Physician: Stacie Glaze (Profile Not Attached) Date of Service: 09/06/2020 [facility], [place]                                                        End Of Treatment Note  Diagnoses: C50.412-Malignant neoplasm of upper-outer quadrant of left female breast  Cancer Staging: Stage IIA, pT2N0M0 grade 2, ER positive invasive lobular carcinoma of the left breast  Intent: Curative  Radiation Treatment Dates: 07/21/2020 through 09/06/2020 Site Technique Total Dose (Gy) Dose per Fx (Gy) Completed Fx Beam Energies  Chest Wall, Left: CW_Lt 3D 50.4/50.4 1.8 28/28 10X  Chest Wall, Left: CW_Lt_Bst Electron 10/10 2 5/5 9E   Narrative: The patient tolerated radiation therapy relatively well. She had some dry discoloration in the treatment field.  Plan: The patient will receive a call in about one month from the radiation oncology department. She will continue follow up with Dr. Merrie Roof and Dr. Laurance Flatten at the [facility] as well.   ________________________________________________    Carola Rhine, [facility]

## 2020-09-19 ENCOUNTER — Emergency Department (HOSPITAL_COMMUNITY): Payer: No Typology Code available for payment source

## 2020-09-19 ENCOUNTER — Emergency Department (HOSPITAL_COMMUNITY)
Admission: EM | Admit: 2020-09-19 | Discharge: 2020-09-19 | Disposition: A | Discharge status: Home / Self Care | Payer: No Typology Code available for payment source | Attending: Emergency Medicine | Admitting: Emergency Medicine

## 2020-09-19 ENCOUNTER — Encounter (HOSPITAL_COMMUNITY): Payer: Self-pay | Admitting: Emergency Medicine

## 2020-09-19 ENCOUNTER — Other Ambulatory Visit: Payer: Self-pay

## 2020-09-19 DIAGNOSIS — Z853 Personal history of malignant neoplasm of breast: Secondary | ICD-10-CM | POA: Diagnosis not present

## 2020-09-19 DIAGNOSIS — M25552 Pain in left hip: Secondary | ICD-10-CM | POA: Diagnosis not present

## 2020-09-19 DIAGNOSIS — I10 Essential (primary) hypertension: Secondary | ICD-10-CM | POA: Diagnosis not present

## 2020-09-19 DIAGNOSIS — M5442 Lumbago with sciatica, left side: Secondary | ICD-10-CM | POA: Insufficient documentation

## 2020-09-19 DIAGNOSIS — Z96641 Presence of right artificial hip joint: Secondary | ICD-10-CM | POA: Diagnosis not present

## 2020-09-19 DIAGNOSIS — M549 Dorsalgia, unspecified: Secondary | ICD-10-CM | POA: Diagnosis present

## 2020-09-19 DIAGNOSIS — F17210 Nicotine dependence, cigarettes, uncomplicated: Secondary | ICD-10-CM | POA: Insufficient documentation

## 2020-09-19 DIAGNOSIS — R110 Nausea: Secondary | ICD-10-CM | POA: Insufficient documentation

## 2020-09-19 DIAGNOSIS — M545 Low back pain, unspecified: Secondary | ICD-10-CM | POA: Diagnosis not present

## 2020-09-19 DIAGNOSIS — R519 Headache, unspecified: Secondary | ICD-10-CM | POA: Diagnosis not present

## 2020-09-19 DIAGNOSIS — Z20822 Contact with and (suspected) exposure to covid-19: Secondary | ICD-10-CM | POA: Diagnosis not present

## 2020-09-19 LAB — CBC WITH DIFFERENTIAL/PLATELET
Abs Immature Granulocytes: 0.01 10*3/uL (ref 0.00–0.07)
Basophils Absolute: 0 10*3/uL (ref 0.0–0.1)
Basophils Relative: 0 %
Eosinophils Absolute: 0.1 10*3/uL (ref 0.0–0.5)
Eosinophils Relative: 2 %
HCT: 45.6 % (ref 36.0–46.0)
Hemoglobin: 14.9 g/dL (ref 12.0–15.0)
Immature Granulocytes: 0 %
Lymphocytes Relative: 21 %
Lymphs Abs: 1.3 10*3/uL (ref 0.7–4.0)
MCH: 30.1 pg (ref 26.0–34.0)
MCHC: 32.7 g/dL (ref 30.0–36.0)
MCV: 92.1 fL (ref 80.0–100.0)
Monocytes Absolute: 0.6 10*3/uL (ref 0.1–1.0)
Monocytes Relative: 9 %
Neutro Abs: 4.1 10*3/uL (ref 1.7–7.7)
Neutrophils Relative %: 68 %
Platelets: 218 10*3/uL (ref 150–400)
RBC: 4.95 MIL/uL (ref 3.87–5.11)
RDW: 15.8 % — ABNORMAL HIGH (ref 11.5–15.5)
WBC: 6.2 10*3/uL (ref 4.0–10.5)
nRBC: 0 % (ref 0.0–0.2)

## 2020-09-19 LAB — URINALYSIS, ROUTINE W REFLEX MICROSCOPIC
Bilirubin Urine: NEGATIVE
Glucose, UA: NEGATIVE mg/dL
Hgb urine dipstick: NEGATIVE
Ketones, ur: NEGATIVE mg/dL
Leukocytes,Ua: NEGATIVE
Nitrite: NEGATIVE
Protein, ur: NEGATIVE mg/dL
Specific Gravity, Urine: 1.018 (ref 1.005–1.030)
pH: 8 (ref 5.0–8.0)

## 2020-09-19 LAB — COMPREHENSIVE METABOLIC PANEL
ALT: 19 U/L (ref 0–44)
AST: 23 U/L (ref 15–41)
Albumin: 4.3 g/dL (ref 3.5–5.0)
Alkaline Phosphatase: 68 U/L (ref 38–126)
Anion gap: 8 (ref 5–15)
BUN: 18 mg/dL (ref 8–23)
CO2: 26 mmol/L (ref 22–32)
Calcium: 10 mg/dL (ref 8.9–10.3)
Chloride: 104 mmol/L (ref 98–111)
Creatinine, Ser: 0.84 mg/dL (ref 0.44–1.00)
GFR, Estimated: >60 mL/min (ref >60)
Glucose, Bld: 93 mg/dL (ref 70–99)
Potassium: 4 mmol/L (ref 3.5–5.1)
Sodium: 138 mmol/L (ref 135–145)
Total Bilirubin: 0.5 mg/dL (ref 0.3–1.2)
Total Protein: 7.9 g/dL (ref 6.5–8.1)

## 2020-09-19 MED ORDER — OXYCODONE HCL 5 MG PO TABS: 5.0000 mg | ORAL_TABLET | ORAL | 0 refills | Status: DC | PRN

## 2020-09-19 MED ORDER — LIDOCAINE 5 % EX PTCH: 1.0000 | MEDICATED_PATCH | CUTANEOUS | 0 refills | Status: AC

## 2020-09-19 MED ORDER — CYCLOBENZAPRINE HCL 10 MG PO TABS: 10.0000 mg | ORAL_TABLET | Freq: Two times a day (BID) | ORAL | 0 refills | Status: AC | PRN

## 2020-09-19 MED ORDER — PREDNISONE 10 MG PO TABS: 40.0000 mg | ORAL_TABLET | Freq: Every day | ORAL | 0 refills | Status: AC

## 2020-09-19 NOTE — ED Notes (Signed)
An After Visit Summary was printed and given to the patient. Discharge instructions given and no further questions at this time.  

## 2020-09-19 NOTE — ED Triage Notes (Signed)
Patient presents with left thigh pain since last Wednesday which has traveled up to her head since then. She also complains of heart palpitations and nausea. Patient is concerned she may have a blood clot.

## 2020-09-19 NOTE — ED Provider Notes (Signed)
[facility] DEPT Provider Note   CSN: [id number] Arrival date & time: 09/19/20  1532     History Chief Complaint  Patient presents with  . Leg Pain  . Headache  . Nausea    Yolanda Alexander is a 70 y.o. female.  HPI      70 year old female with a history of breast cancer, depression, hypertension, presents with concern for headache, nausea, left back and hip pain.  Reports that symptoms began on Wednesday, reports left-sided headache.  Reports associated nausea.  Denies numbness, weakness, difficulty talking, walking, change in vision, facial droop.  Denies trauma, fevers, cough.  Reports that the headache started slowly and has been persistent.  She also describes more significant pain to her left hip/upper leg/back.  She is worried it could be a blood clot.  Denies any calf pain or asymmetric leg swelling.  Denies chest pain or shortness of breath.  The pain is significantly worse with movements, and she finds it difficult to walk.  The pain is severe, 10 out of 10.  Past Medical History:  Diagnosis Date  . Arthritis   . Breast cancer ([place])   . Depression   . Hypertension     Patient Active Problem List   Diagnosis Date Noted  . Malignant neoplasm of upper-outer quadrant of left breast in female, estrogen receptor positive ([place]) 02/02/2020    Past Surgical History:  Procedure Laterality Date  . left breast excisional biopsy Left 1976  . shoulder and hand surgeries    . TOTAL HIP ARTHROPLASTY Right 03/2019     OB History   No obstetric history on file.     Family History  Problem Relation Age of Onset  . Multiple myeloma Father   . Diabetes Mother   . Stroke Mother   . Cancer Sister        Gallbladder  . Bladder Cancer Brother   . Pancreatic cancer Sister     Social History   Tobacco Use  . Smoking status: Current Some Day Smoker    Types: Cigarettes  . Smokeless tobacco: Never Used  Substance Use Topics  . Alcohol use: Yes     Comment: Since Cancer diagnosis, She has some glasses of wine.  . Drug use: Never    Home Medications Prior to Admission medications   Medication Sig Start Date End Date Taking? Authorizing Provider  cyclobenzaprine (FLEXERIL) 10 MG tablet Take 1 tablet (10 mg total) by mouth 2 (two) times daily as needed for muscle spasms. 09/19/20  Yes Gareth Morgan, MD  lidocaine (LIDODERM) 5 % Place 1 patch onto the skin daily. Remove & Discard patch within 12 hours or as directed by MD 09/19/20  Yes Gareth Morgan, MD  oxyCODONE (ROXICODONE) 5 MG immediate release tablet Take 1 tablet (5 mg total) by mouth every 4 (four) hours as needed for severe pain. 09/19/20  Yes Gareth Morgan, MD  predniSONE (DELTASONE) 10 MG tablet Take 4 tablets (40 mg total) by mouth daily for 5 days. 09/19/20 09/24/20 Yes Gareth Morgan, MD  ascorbic acid (VITAMIN C) 250 MG tablet Take by mouth.    [provider]  Cholecalciferol 50 MCG (2000 UT) CAPS Take 1 capsule by mouth daily.    [provider]  Flaxseed, Linseed, (FLAXSEED OIL PO) Take by mouth.    [provider]  FLUoxetine (PROZAC) 10 MG capsule Take by mouth.    [provider]  hydrOXYzine (ATARAX/VISTARIL) 10 MG tablet Take by mouth.  [provider]  Multiple Vitamins-Minerals (ONE-A-DAY WOMENS 50+ ADVANTAGE) TABS Take 1 tablet by mouth daily.    [provider]  NON FORMULARY Bedoyecta Capsules- Take 1 capsule by mouth daily.    [provider]  zinc gluconate 50 MG tablet Take 1 tablet by mouth daily.    [provider]    Allergies    Citrullus vulgaris, Daucus carota, Orange fruit [citrus], Prunus persica, and Tomato  Review of Systems   Review of Systems  Constitutional: Negative for fever.  HENT: Negative for sore throat.   Eyes: Negative for visual disturbance.  Respiratory: Negative for cough and shortness of breath.   Cardiovascular: Negative for chest pain.   Gastrointestinal: Positive for nausea. Negative for abdominal pain, diarrhea and vomiting.  Genitourinary: Negative for difficulty urinating.  Musculoskeletal: Positive for arthralgias, back pain and myalgias. Negative for neck pain.  Skin: Negative for rash.  Neurological: Positive for headaches. Negative for syncope.    Physical Exam Updated Vital Signs BP (!) 169/99 (BP Location: Right Arm)   Pulse 79   Temp 99.3 F (37.4 C) (Oral)   Resp 16   Ht 5' 5"  (1.651 m)   Wt 90.7 kg   SpO2 96%   BMI 33.28 kg/m   Physical Exam Vitals and nursing note reviewed.  Constitutional:      General: She is not in acute distress.    Appearance: Normal appearance. She is well-developed. She is not ill-appearing or diaphoretic.  HENT:     Head: Normocephalic and atraumatic.  Eyes:     General: No visual field deficit.    Extraocular Movements: Extraocular movements intact.     Conjunctiva/sclera: Conjunctivae normal.     Pupils: Pupils are equal, round, and reactive to light.  Cardiovascular:     Rate and Rhythm: Normal rate and regular rhythm.     Pulses: Normal pulses.     Heart sounds: Normal heart sounds. No murmur heard. No friction rub. No gallop.   Pulmonary:     Effort: Pulmonary effort is normal. No respiratory distress.     Breath sounds: Normal breath sounds. No wheezing or rales.  Abdominal:     General: There is no distension.     Palpations: Abdomen is soft.     Tenderness: There is no abdominal tenderness. There is no guarding.  Musculoskeletal:        General: No swelling or tenderness.     Cervical back: Normal range of motion.     Comments: Tenderness to lumbar spine, left side of back and pelvis  Skin:    General: Skin is warm and dry.     Findings: No erythema or rash.  Neurological:     General: No focal deficit present.     Mental Status: She is alert and oriented to person, place, and time.     GCS: GCS eye subscore is 4. GCS verbal subscore is 5. GCS  motor subscore is 6.     Cranial Nerves: No cranial nerve deficit, dysarthria or facial asymmetry.     Sensory: No sensory deficit.     Motor: No weakness or tremor.     Coordination: Coordination normal. Finger-Nose-Finger Test normal.     Gait: Gait normal.     ED Results / Procedures / Treatments   Labs (all labs ordered are listed, but only abnormal results are displayed) Labs Reviewed  URINALYSIS, ROUTINE W REFLEX MICROSCOPIC - Abnormal; Notable for the following components:  Result Value   APPearance HAZY (*)    All other components within normal limits  CBC WITH DIFFERENTIAL/PLATELET - Abnormal; Notable for the following components:   RDW 15.8 (*)    All other components within normal limits  SARS CORONAVIRUS 2 (TAT 6-24 HRS)  URINE CULTURE  COMPREHENSIVE METABOLIC PANEL    EKG None  Radiology DG Lumbar Spine Complete  Result Date: 09/19/2020 CLINICAL DATA:  70 year old female with back pain. EXAM: LUMBAR SPINE - COMPLETE 4+ VIEW COMPARISON:  None. FINDINGS: Age indeterminate, likely chronic fracture of superior endplate of L1 with mild anterior wedging. Mild 80 determinate, chronic appearing fracture of inferior endplate of L3. No definite acute fracture. Multilevel degenerative changes with endplate irregularity and disc space narrowing primarily at L5-S1 and L3-L4. There is grade 1 L3-L4 anterolisthesis. Multilevel facet arthropathy. The visualized posterior elements appear intact. The soft tissues are unremarkable. Partially visualized right hip arthroplasty. IMPRESSION: 1. Age indeterminate, likely chronic, mild compression fractures of L1 and L3. 2. Multilevel degenerative changes of the lumbar spine. Electronically Signed   By: Anner Crete M.D.   On: 09/19/2020 18:22   CT Head Wo Contrast  Result Date: 09/19/2020 CLINICAL DATA:  Headache. EXAM: CT HEAD WITHOUT CONTRAST TECHNIQUE: Contiguous axial images were obtained from the base of the skull through the  vertex without intravenous contrast. COMPARISON:  None. FINDINGS: Brain: No evidence of acute infarction, hemorrhage, hydrocephalus, extra-axial collection or mass lesion/mass effect. Vascular: No hyperdense vessel or unexpected calcification. Skull: No fracture or focal lesion. Sinuses/Orbits: Paranasal sinuses and mastoid air cells are clear. The visualized orbits are unremarkable. Other: None. IMPRESSION: Negative head CT. Electronically Signed   By: Keith Rake M.D.   On: 09/19/2020 19:26   DG Hip Unilat W or Wo Pelvis 2-3 Views Left  Result Date: 09/19/2020 CLINICAL DATA:  Left hip pain. EXAM: DG HIP (WITH OR WITHOUT PELVIS) 2-3V LEFT COMPARISON:  Right hip radiographs 05/19/2020 FINDINGS: Left joint space narrowing. Lateral femoral head neck and acetabular spurring. No evidence of fracture, avascular necrosis, or focal bone lesion. Pubic rami are intact. Right hip arthroplasty is intact were visualized pubic symphysis and sacroiliac joints are congruent. IMPRESSION: Moderate left hip osteoarthritis. Electronically Signed   By: Keith Rake M.D.   On: 09/19/2020 18:22    Procedures Procedures   Medications Ordered in ED Medications - No data to display  ED Course  I have reviewed the triage vital signs and the nursing notes.  Pertinent labs & imaging results that were available during my care of the patient were reviewed by me and considered in my medical decision making (see chart for details).    MDM Rules/Calculators/A&P                          70 year old female with a history of breast cancer, depression, hypertension, presents with concern for headache, nausea, left back and hip pain.  CT head completed shows no evidence of acute intracranial abnormality or bleed.  Have low suspicion for meningitis.  History and exam do not suggest CVA.  Low suspicion for occult subarachnoid hemorrhage.  Recommend PCP follow-up for headache given cancer history.  Regarding back and leg  pain: Patient has a normal neurologic exam and denies any urinary retention or overflow incontinence, stool incontinence, saddle anesthesia, fever, IV drug use, trauma, chronic steroid use or immunocompromise and have low suspicion suspicion for cauda equina, fracture, epidural abscess, or vertebral osteomyelitis.  X-ray of  the spine shows age-indeterminate, likely chronic compression fracture and degenerative changes of the spine.  She does not have asymmetric leg swelling, calf pain, and history and exam are more consistent with lumbar etiology of and radiating pain and have low suspicion for DVT.  Suspect sciatica, possible disc herniation. Will give rx for short course of steroids and other pain medications. Recommend PCP follow up. Labs obtained given nausea and decreased po intake show no significant abnormalities. Patient discharged in stable condition with understanding of reasons to return.     Final Clinical Impression(s) / ED Diagnoses Final diagnoses:  Acute nonintractable headache, unspecified headache type  Nausea  Acute left-sided low back pain with left-sided sciatica    Rx / DC Orders ED Discharge Orders         Ordered    predniSONE (DELTASONE) 10 MG tablet  Daily        09/19/20 2117    cyclobenzaprine (FLEXERIL) 10 MG tablet  2 times daily PRN        09/19/20 2117    lidocaine (LIDODERM) 5 %  Every 24 hours        09/19/20 2117    oxyCODONE (ROXICODONE) 5 MG immediate release tablet  Every 4 hours PRN        09/19/20 2118           Gareth Morgan, MD 09/20/20 1320

## 2020-09-20 ENCOUNTER — Telehealth (HOSPITAL_COMMUNITY): Payer: Self-pay | Admitting: Emergency Medicine

## 2020-09-20 LAB — SARS CORONAVIRUS 2 (TAT 6-24 HRS): SARS Coronavirus 2: NEGATIVE

## 2020-09-20 MED ORDER — OXYCODONE HCL 5 MG PO CAPS: 5.0000 mg | ORAL_CAPSULE | ORAL | 0 refills | Status: DC | PRN

## 2020-09-20 MED ORDER — OXYCODONE HCL 5 MG PO CAPS: 5.0000 mg | ORAL_CAPSULE | ORAL | 0 refills | Status: AC | PRN

## 2020-09-20 NOTE — Telephone Encounter (Signed)
[facility] called and [facility] transfer to the [place].  Only prescription that was not given to the [facility] was the narcotic and that is unchanged.

## 2020-09-21 LAB — URINE CULTURE

## 2020-10-05 ENCOUNTER — Telehealth: Payer: Self-pay | Admitting: Radiation Oncology

## 2020-10-05 NOTE — Telephone Encounter (Signed)
Returned patient's call regarding proof of 07/13/20 FUN w/ Shona Simpson, PA. Faxed note to [facility] per patient request. Received fax successful notice.

## 2020-10-24 ENCOUNTER — Other Ambulatory Visit: Payer: Self-pay

## 2020-10-24 ENCOUNTER — Ambulatory Visit
Admission: RE | Admit: 2020-10-24 | Discharge: 2020-10-24 | Disposition: A | Discharge status: Home / Self Care | Payer: Medicare Other | Source: Ambulatory Visit | Attending: Radiation Oncology | Admitting: Radiation Oncology

## 2020-10-24 DIAGNOSIS — Z170 Estrogen receptor positive status [ER+]: Secondary | ICD-10-CM

## 2020-10-25 NOTE — Progress Notes (Signed)
  Radiation Oncology         [phone] ________________________________  Name: Yolanda Alexander MRN: [id number]  Date of Service: 10/24/2020  DOB: 1950-05-15  Post Treatment Telephone Note  Diagnosis:   Stage IIA, pT2N0M0 grade 2, ER positive invasive lobular carcinoma of the left breast  Interval Since Last Radiation:  7 weeks   07/21/2020 through 09/06/2020 Site Technique Total Dose (Gy) Dose per Fx (Gy) Completed Fx Beam Energies  Chest Wall, Left: CW_Lt 3D 50.4/50.4 1.8 28/28 10X  Chest Wall, Left: CW_Lt_Bst Electron 10/10 2 5/5 9E    Narrative:  The patient was contacted today for routine follow-up. During treatment she did very well with radiotherapy and did not have significant desquamation. She reports she is doing well and feels that she's doing well overall. She is having some swelling in her arm and upper chest and is getting set up for an evaluation through the [facility] [place] for PT. Her skin is doing well per report but is still slightly tender.  Impression/Plan: 1. Stage IIA, pT2N0M0 grade 2, ER positive invasive lobular carcinoma of the left breast. The patient has been doing well since completion of radiotherapy. We discussed that we would be happy to continue to follow her as needed, but she will also continue to follow up with Dr. Merrie Roof in medical oncology and Dr. Laurance Flatten at the [facility]. She was counseled on skin care as well as measures to avoid sun exposure to this area.           Carola Rhine, PAC

## 2020-11-07 DIAGNOSIS — Z923 Personal history of irradiation: Secondary | ICD-10-CM | POA: Diagnosis not present

## 2020-11-07 DIAGNOSIS — Z9012 Acquired absence of left breast and nipple: Secondary | ICD-10-CM | POA: Diagnosis not present

## 2020-11-07 DIAGNOSIS — Z853 Personal history of malignant neoplasm of breast: Secondary | ICD-10-CM | POA: Diagnosis not present

## 2020-11-07 DIAGNOSIS — Z08 Encounter for follow-up examination after completed treatment for malignant neoplasm: Secondary | ICD-10-CM | POA: Diagnosis not present

## 2022-04-17 IMAGING — CT CT HEAD W/O CM
3 series · 16 of 47 positions shown, 19 images · non-contrast
Comparison: None.

CLINICAL DATA: Headache.

EXAM:
CT HEAD WITHOUT CONTRAST
TECHNIQUE: Contiguous axial images were obtained from the base of the skull
through the vertex without intravenous contrast.

[Series 2: head wo · axial · 0.46mm/px · z∈[-140,-5]mm · 10 of 33 slices shown, 13 images]
[im 3/33  brain]
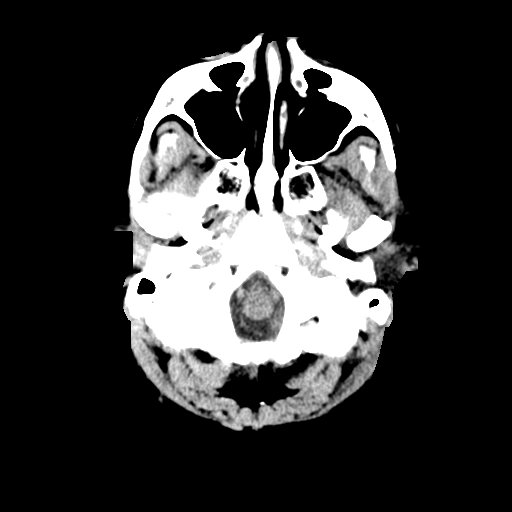
[im 3/33  bone]
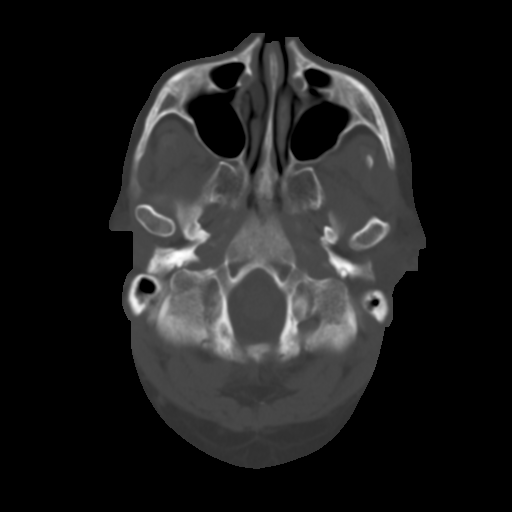
[im 6/33  brain]
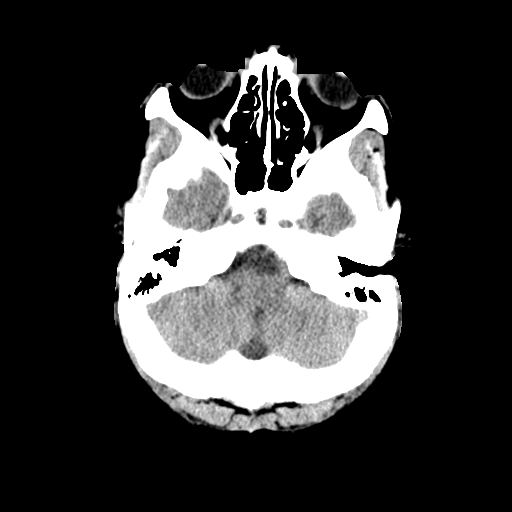
[im 9/33  brain]
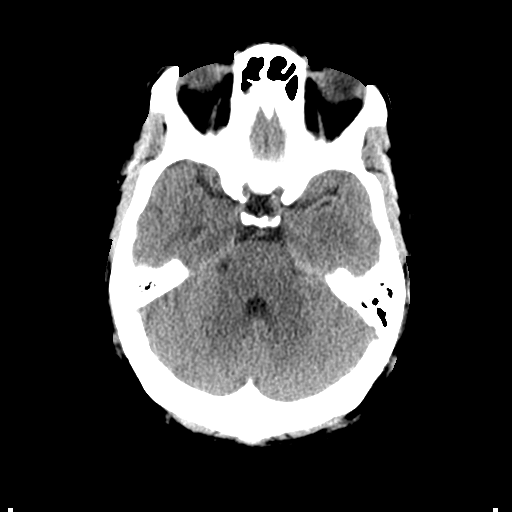
[im 12/33  brain]
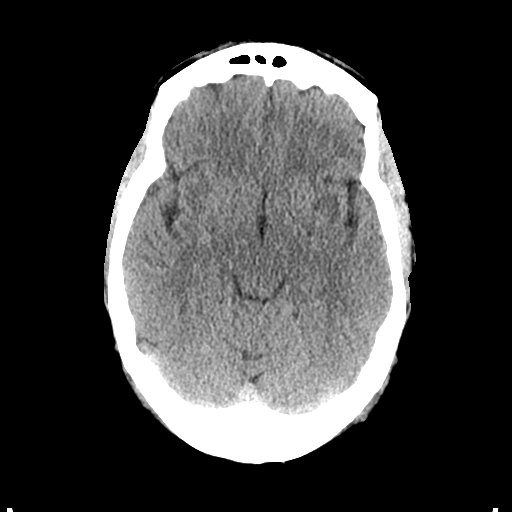
[im 15/33  brain]
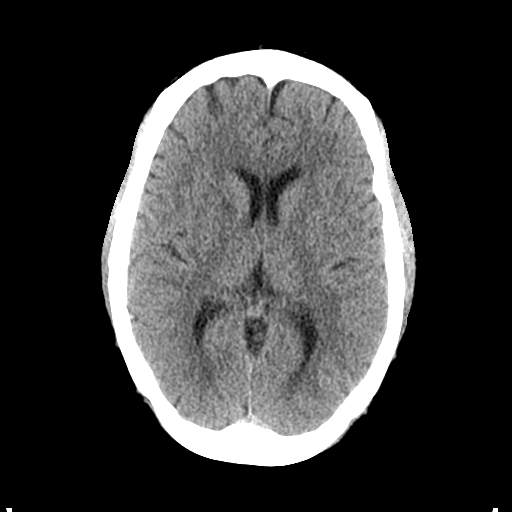
[im 15/33  bone]
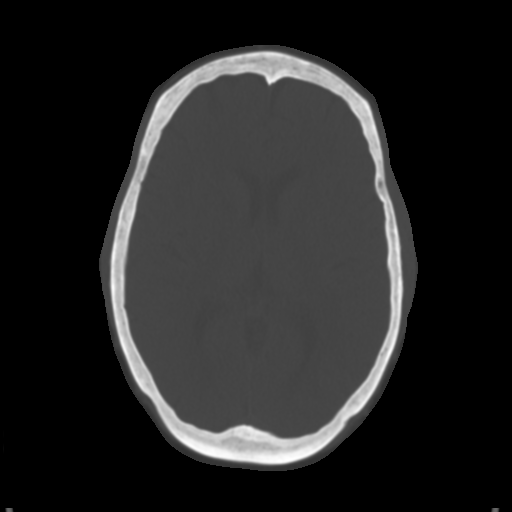
[im 18/33  brain]
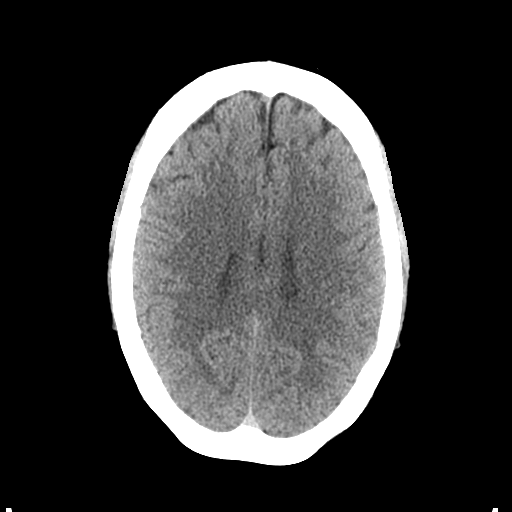
[im 21/33  brain]
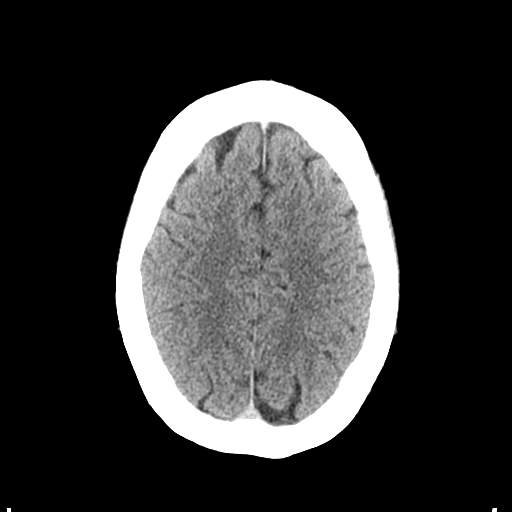
[im 25/33  brain]
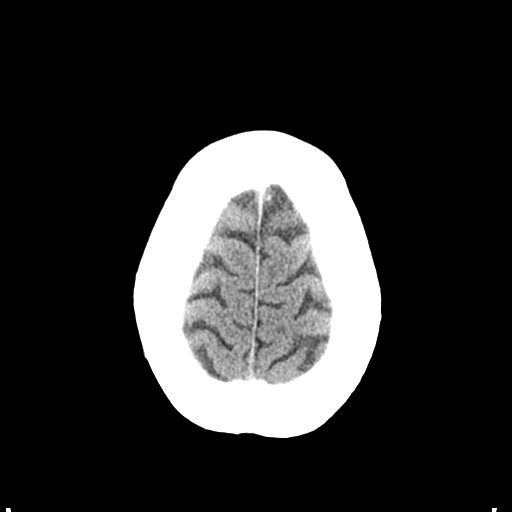
[im 27/33  brain]
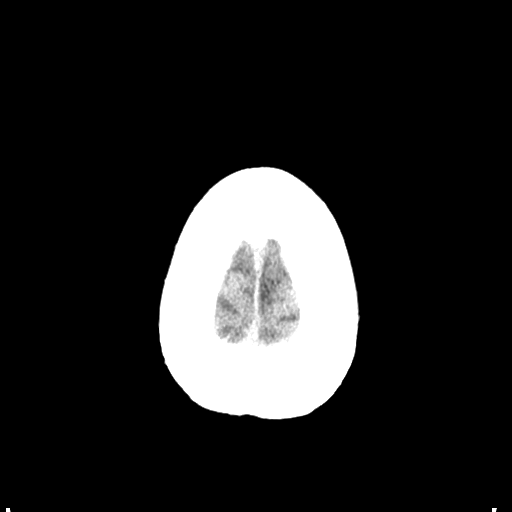
[im 27/33  bone]
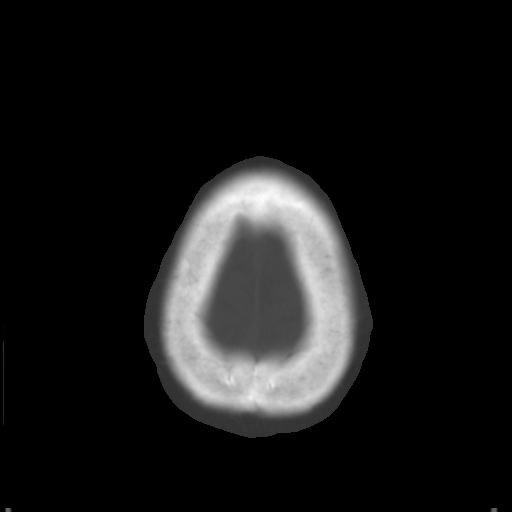
[im 30/33  brain]
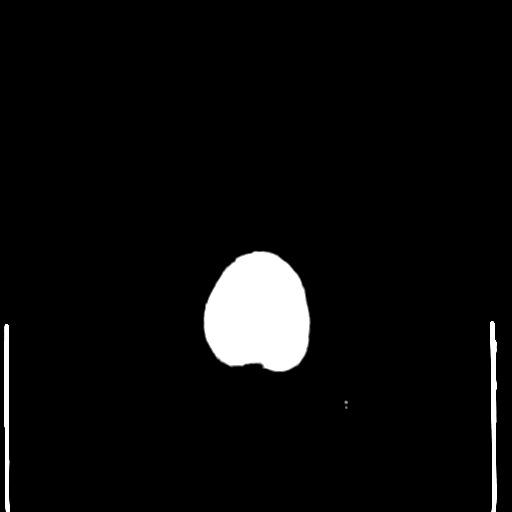

[Series 5: coronal soft tissue · coronal · 0.30mm/px · 3 of 72 slices shown]
[im 24/72  brain]
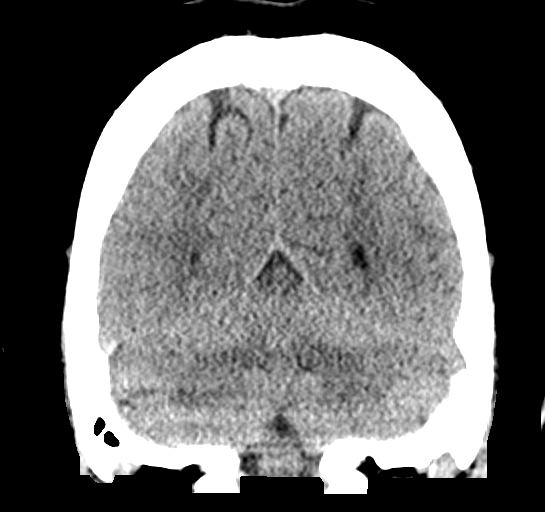
[im 32/72  brain]
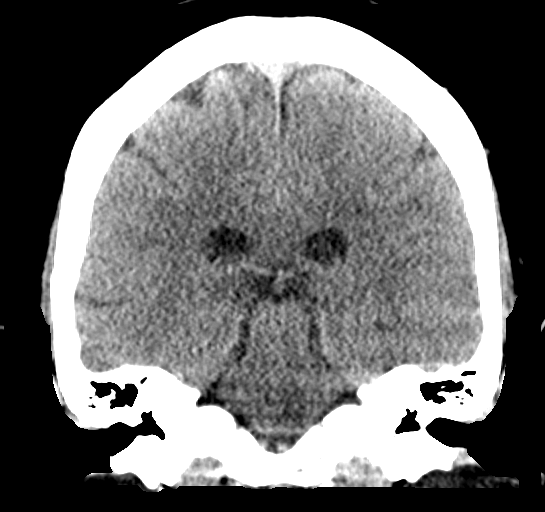
[im 40/72  brain]
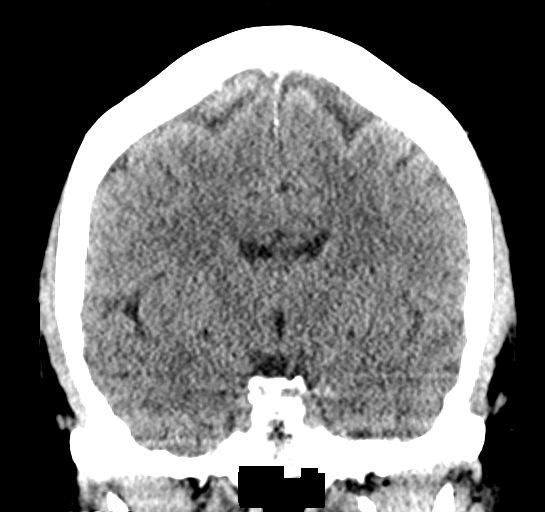

[Series 6: sagittal soft tissue · sagittal · 0.32mm/px · 3 of 56 slices shown]
[im 19/56  brain]
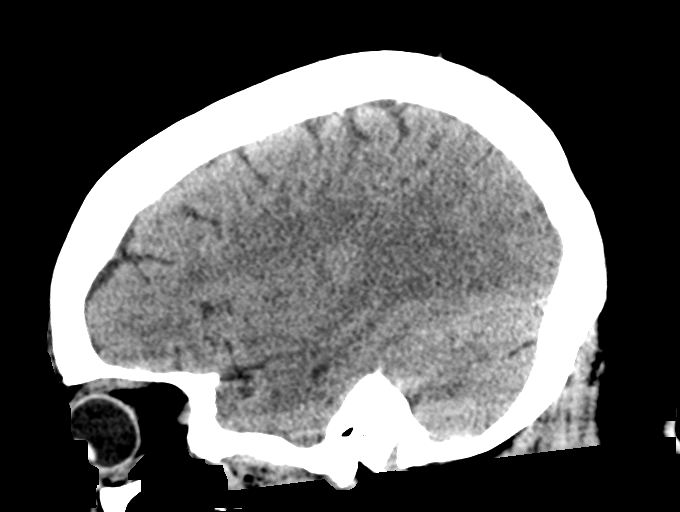
[im 28/56  brain]
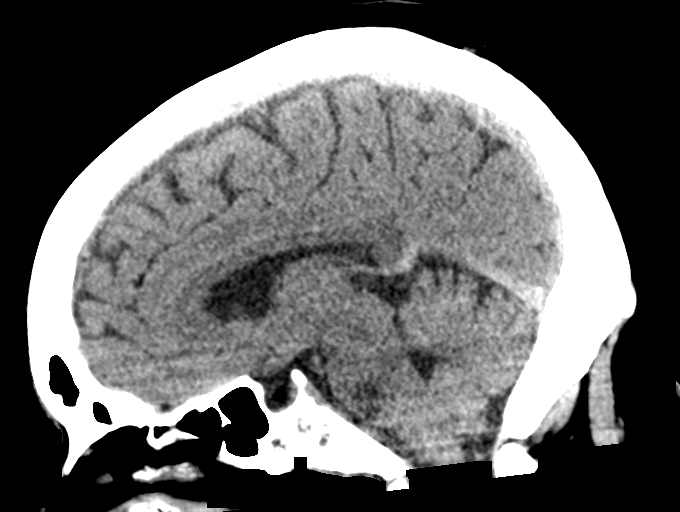
[im 37/56  brain]
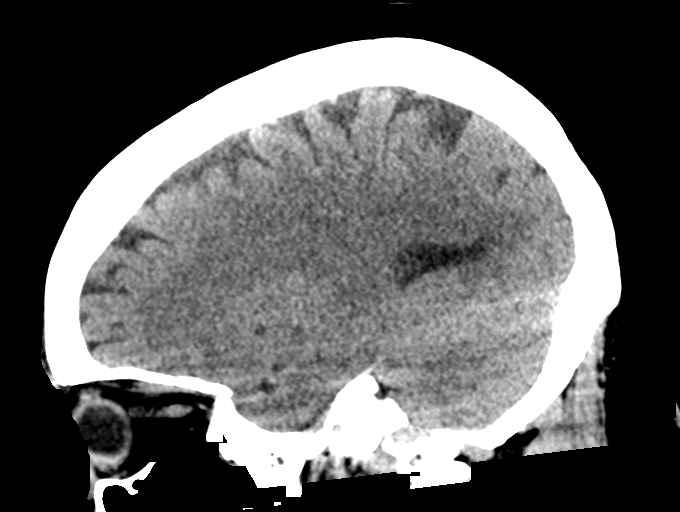

[16 of 47 positions shown; findings below may reference images not displayed]

FINDINGS: Brain: No evidence of acute infarction, hemorrhage, hydrocephalus,
extra-axial collection or mass lesion/mass effect.

Vascular: No hyperdense vessel or unexpected calcification.

Skull: No fracture or focal lesion.

Sinuses/Orbits: Paranasal sinuses and mastoid air cells are clear.
The visualized orbits are unremarkable.

Other: None.
IMPRESSION: Negative head CT.

## 2022-04-17 IMAGING — CR DG HIP (WITH OR WITHOUT PELVIS) 2-3V*L*
3 series · 3 of 3 positions shown · non-contrast
Comparison: Right hip radiographs 05/19/2020

CLINICAL DATA: Left hip pain.

EXAM:
DG HIP (WITH OR WITHOUT PELVIS) 2-3V LEFT

[t pelvis ap]
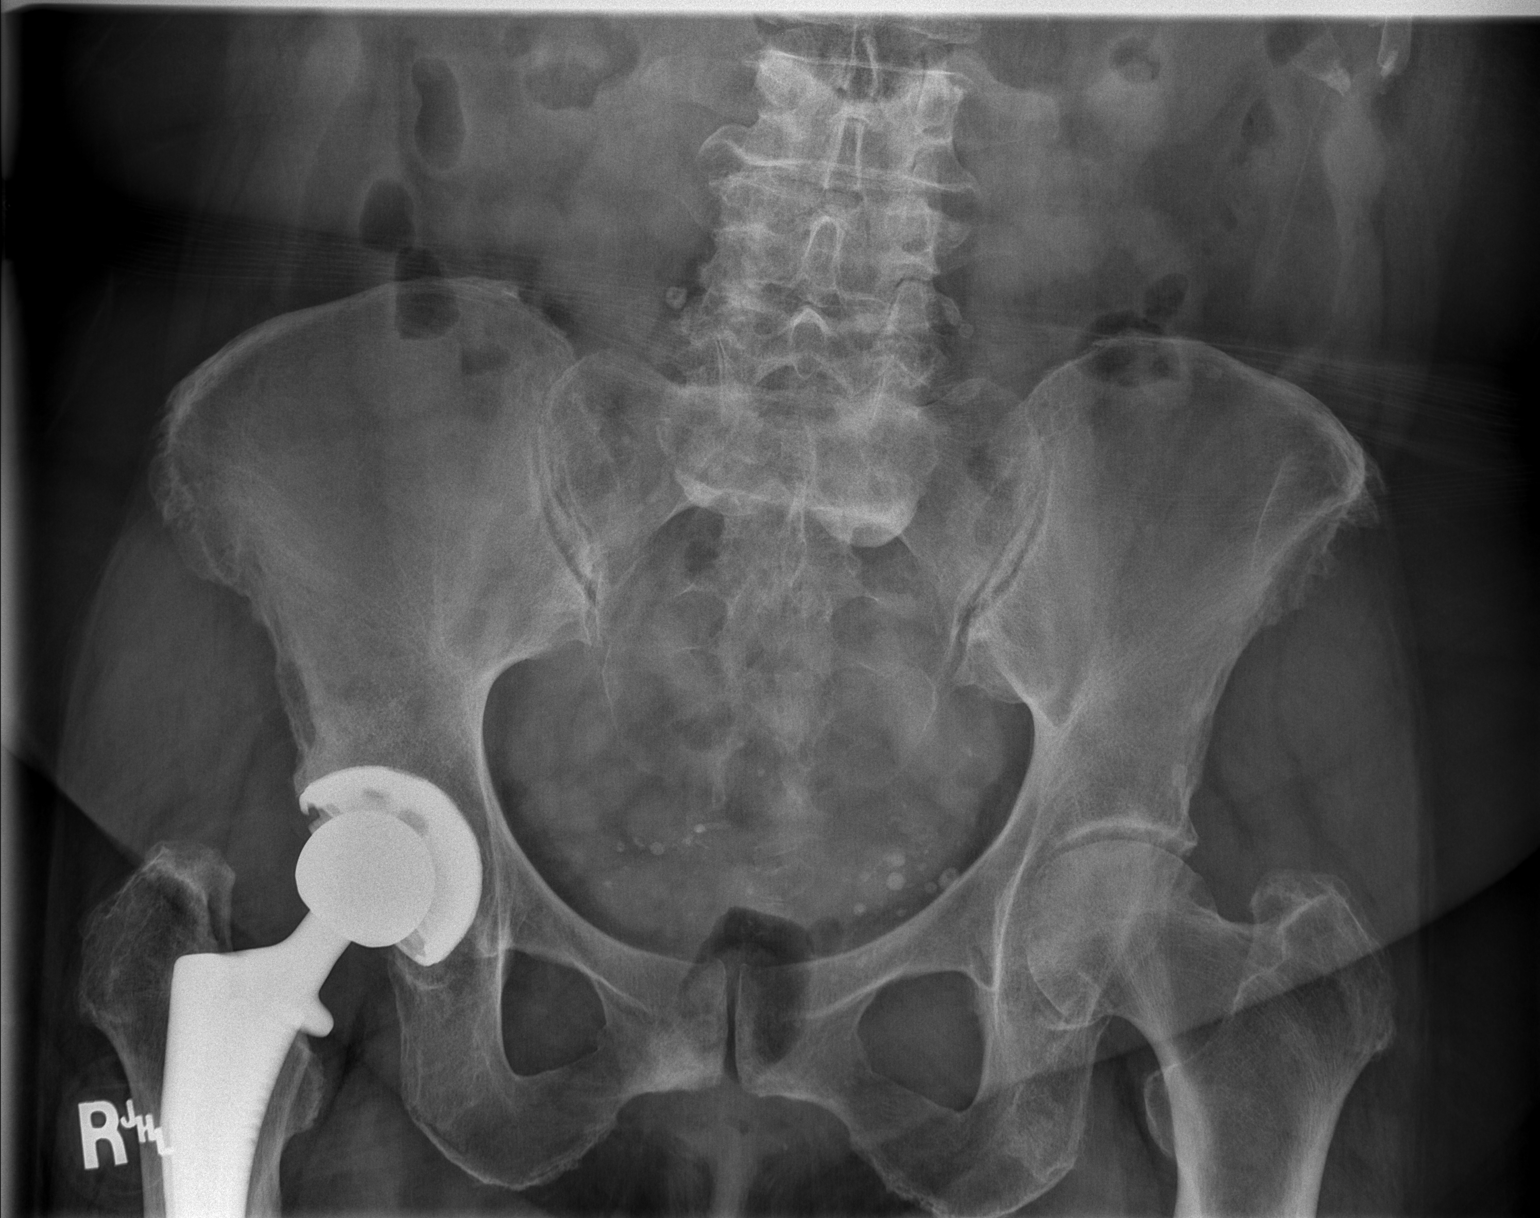

[t hip ap left]
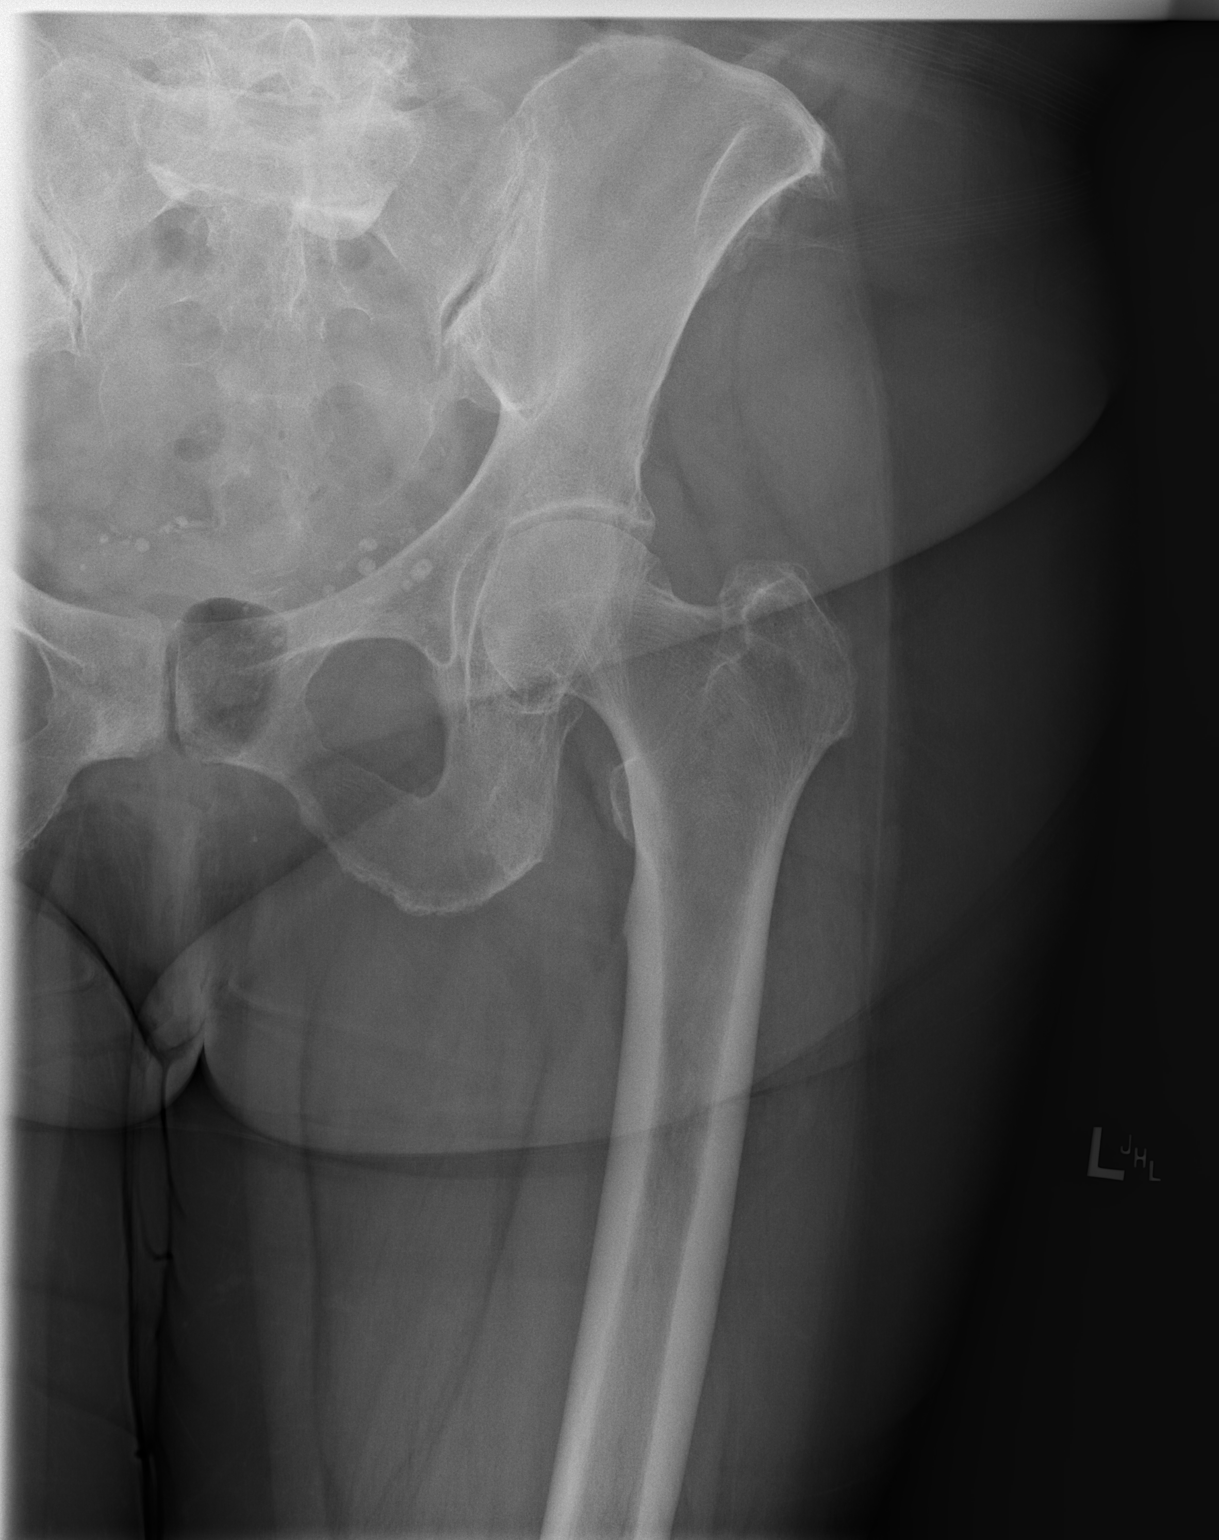

[t hip frog leg left]
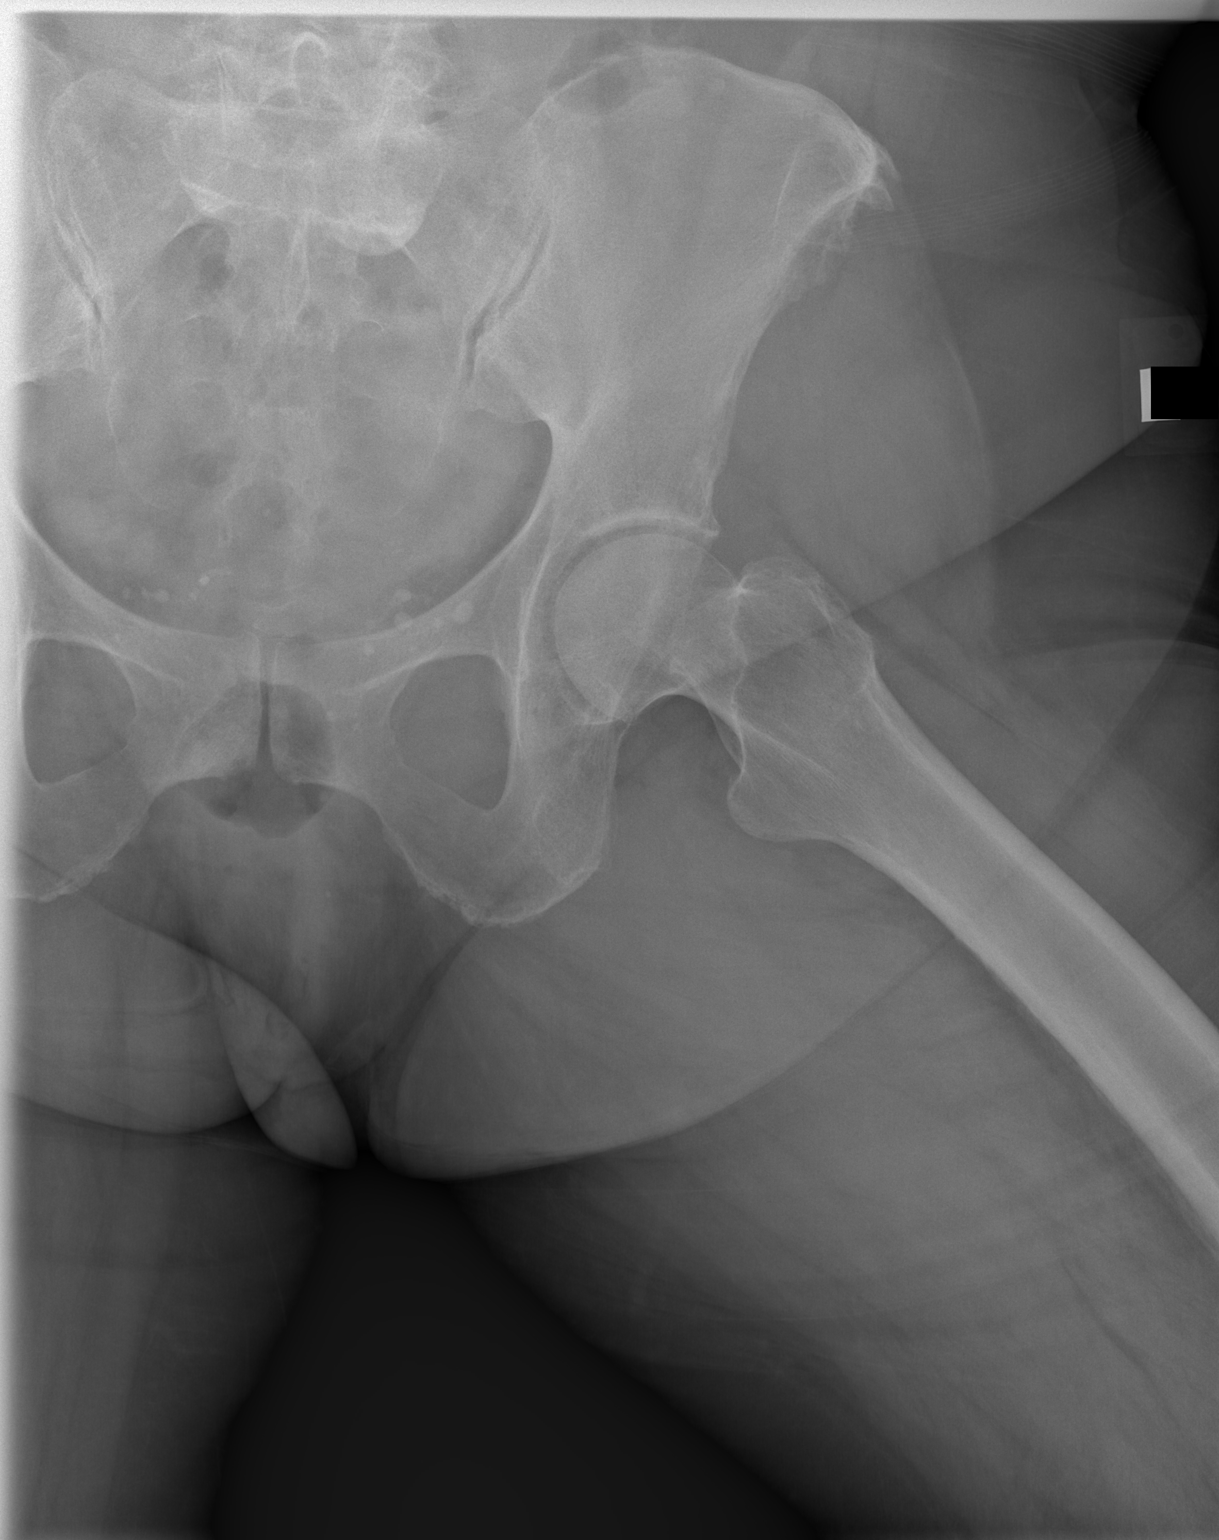

[3 of 3 positions shown; findings below may reference images not displayed]

FINDINGS: Left joint space narrowing. Lateral femoral head neck and acetabular
spurring. No evidence of fracture, avascular necrosis, or focal bone
lesion. Pubic rami are intact. Right hip arthroplasty is intact were
visualized pubic symphysis and sacroiliac joints are congruent.
IMPRESSION: Moderate left hip osteoarthritis.

## 2022-04-17 IMAGING — CR DG LUMBAR SPINE COMPLETE 4+V
5 series · 5 of 5 positions shown · non-contrast
Comparison: None.

CLINICAL DATA: 69-year-old female with back pain.

EXAM:
LUMBAR SPINE - COMPLETE 4+ VIEW

[t lumbar spine ap]
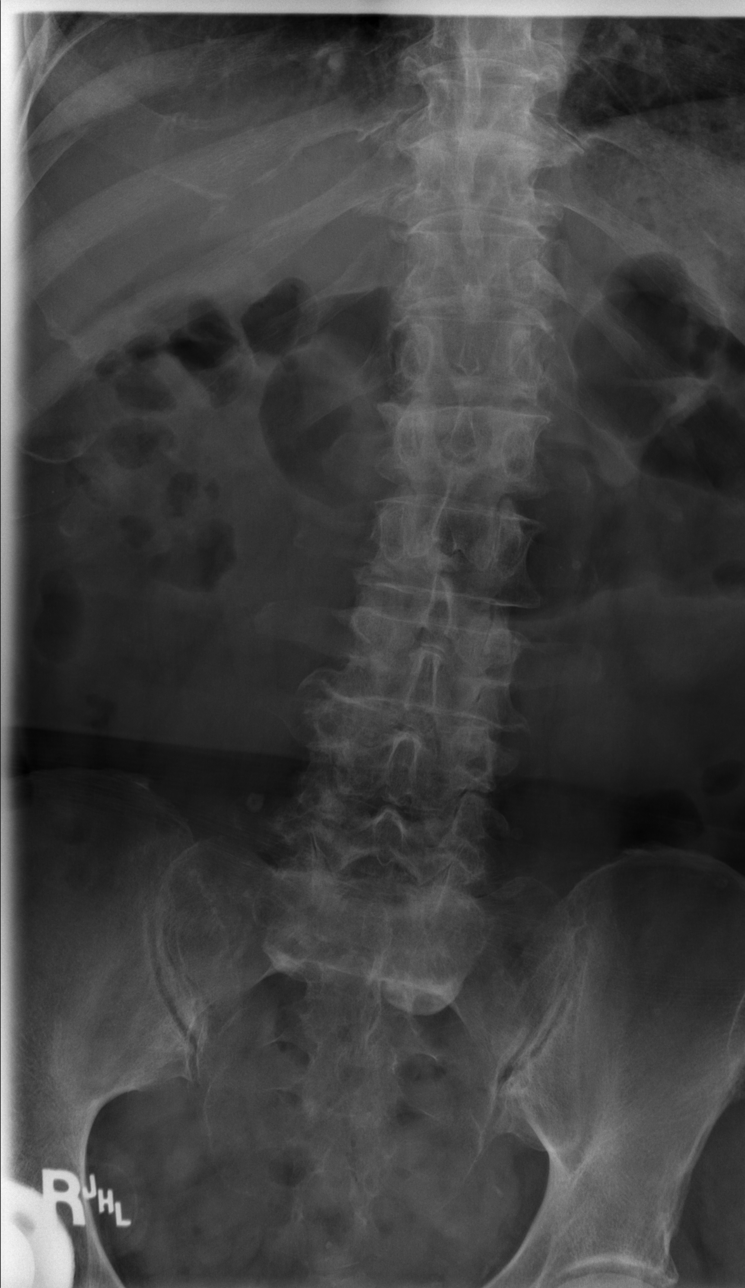

[t lumbar spine obl (1 of 2)]
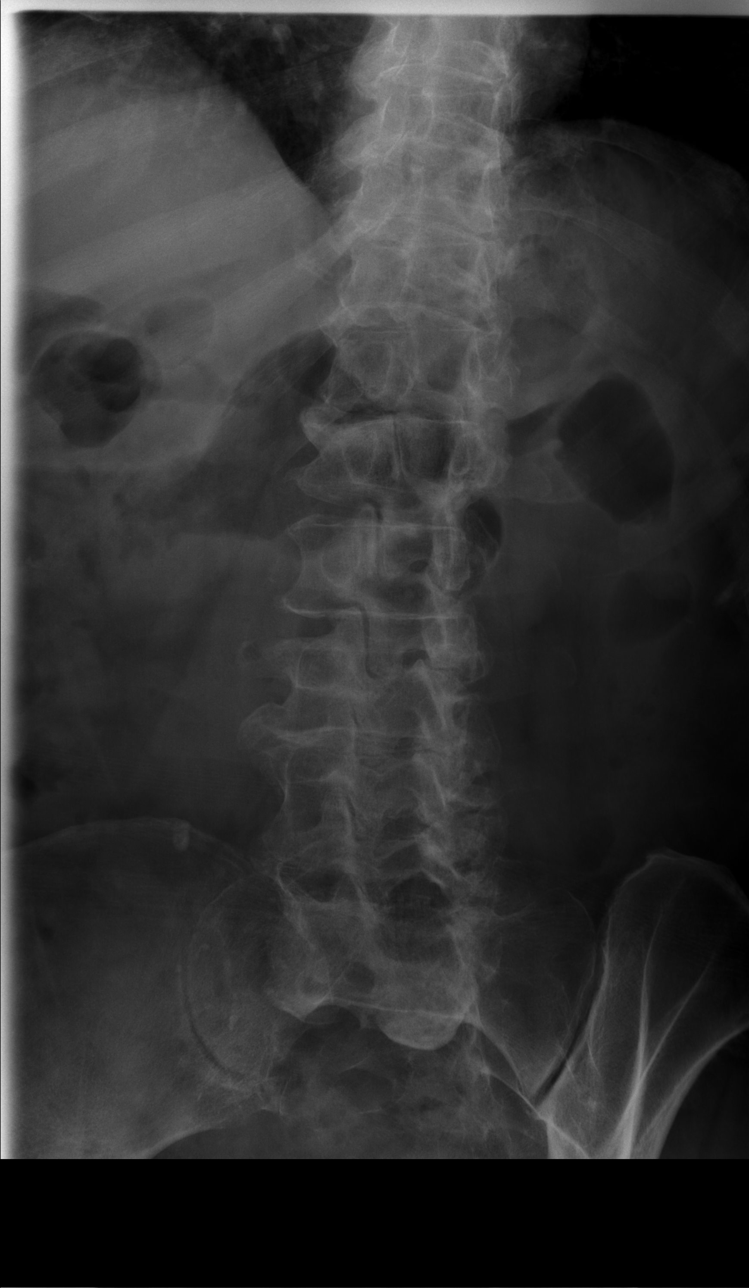

[t lumbar spine obl (2 of 2)]
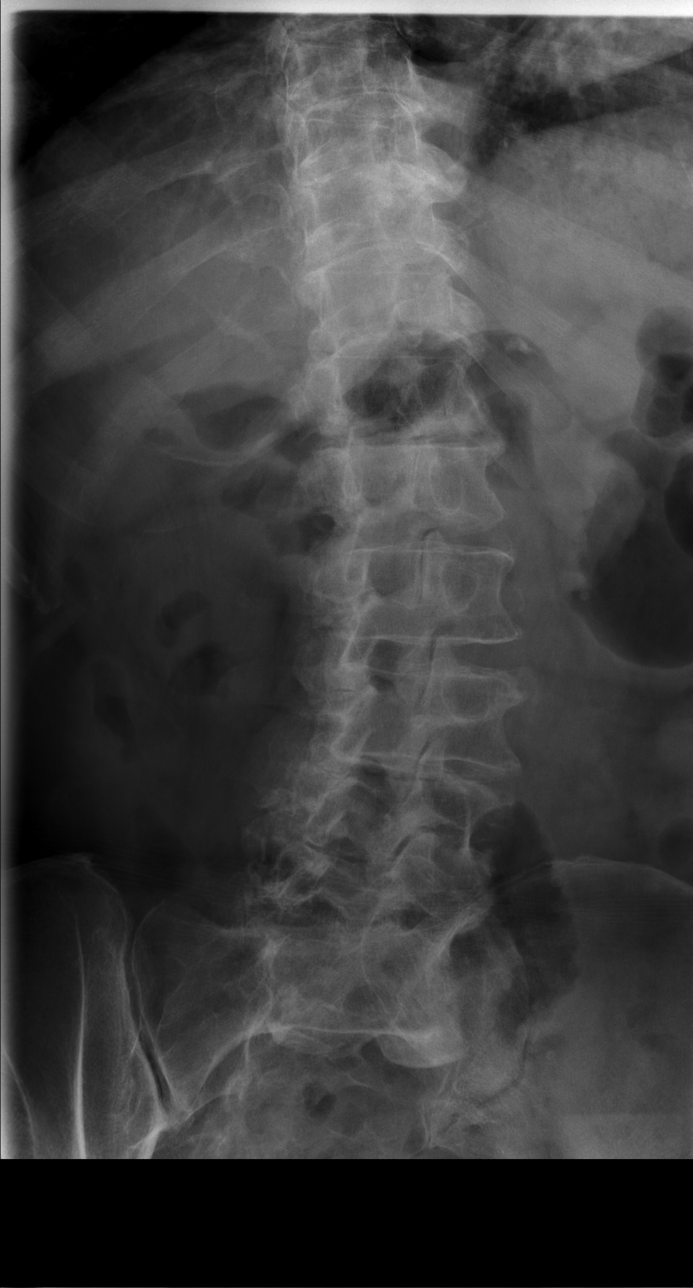

[t lumbar spine lat]
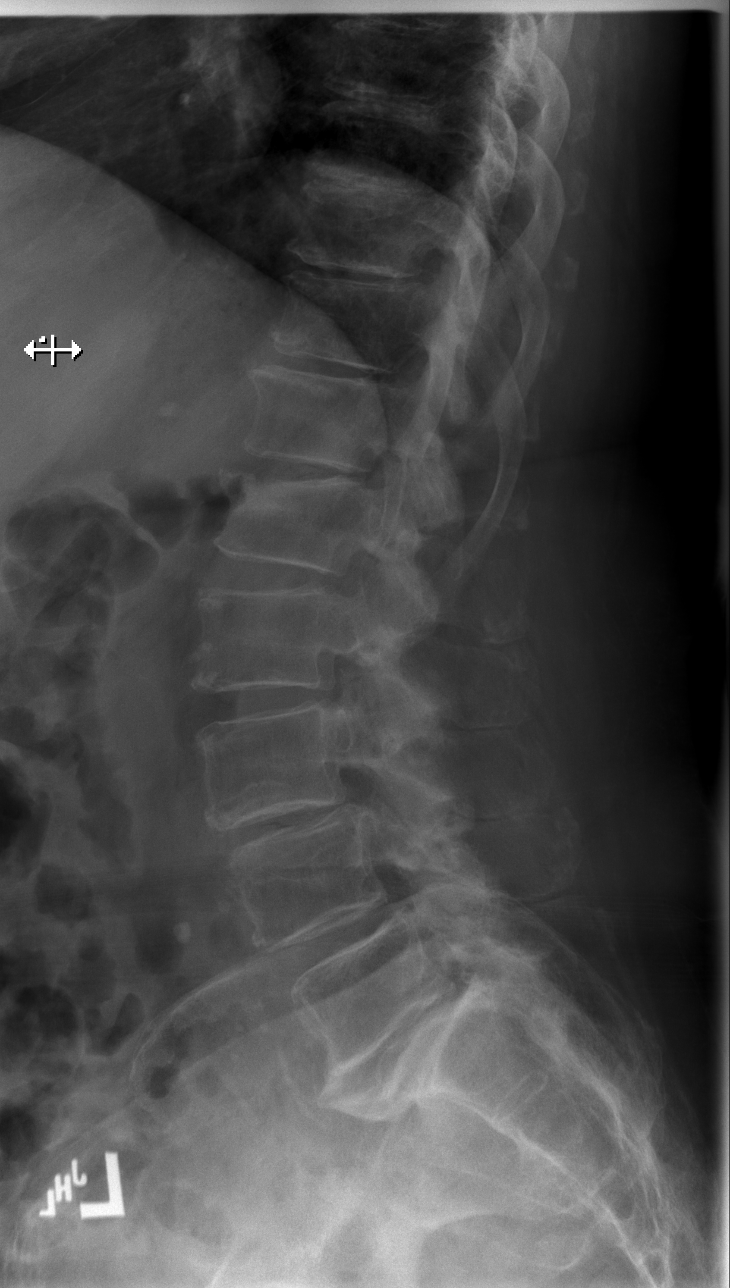

[t lumbar l-5 s-1 spot]
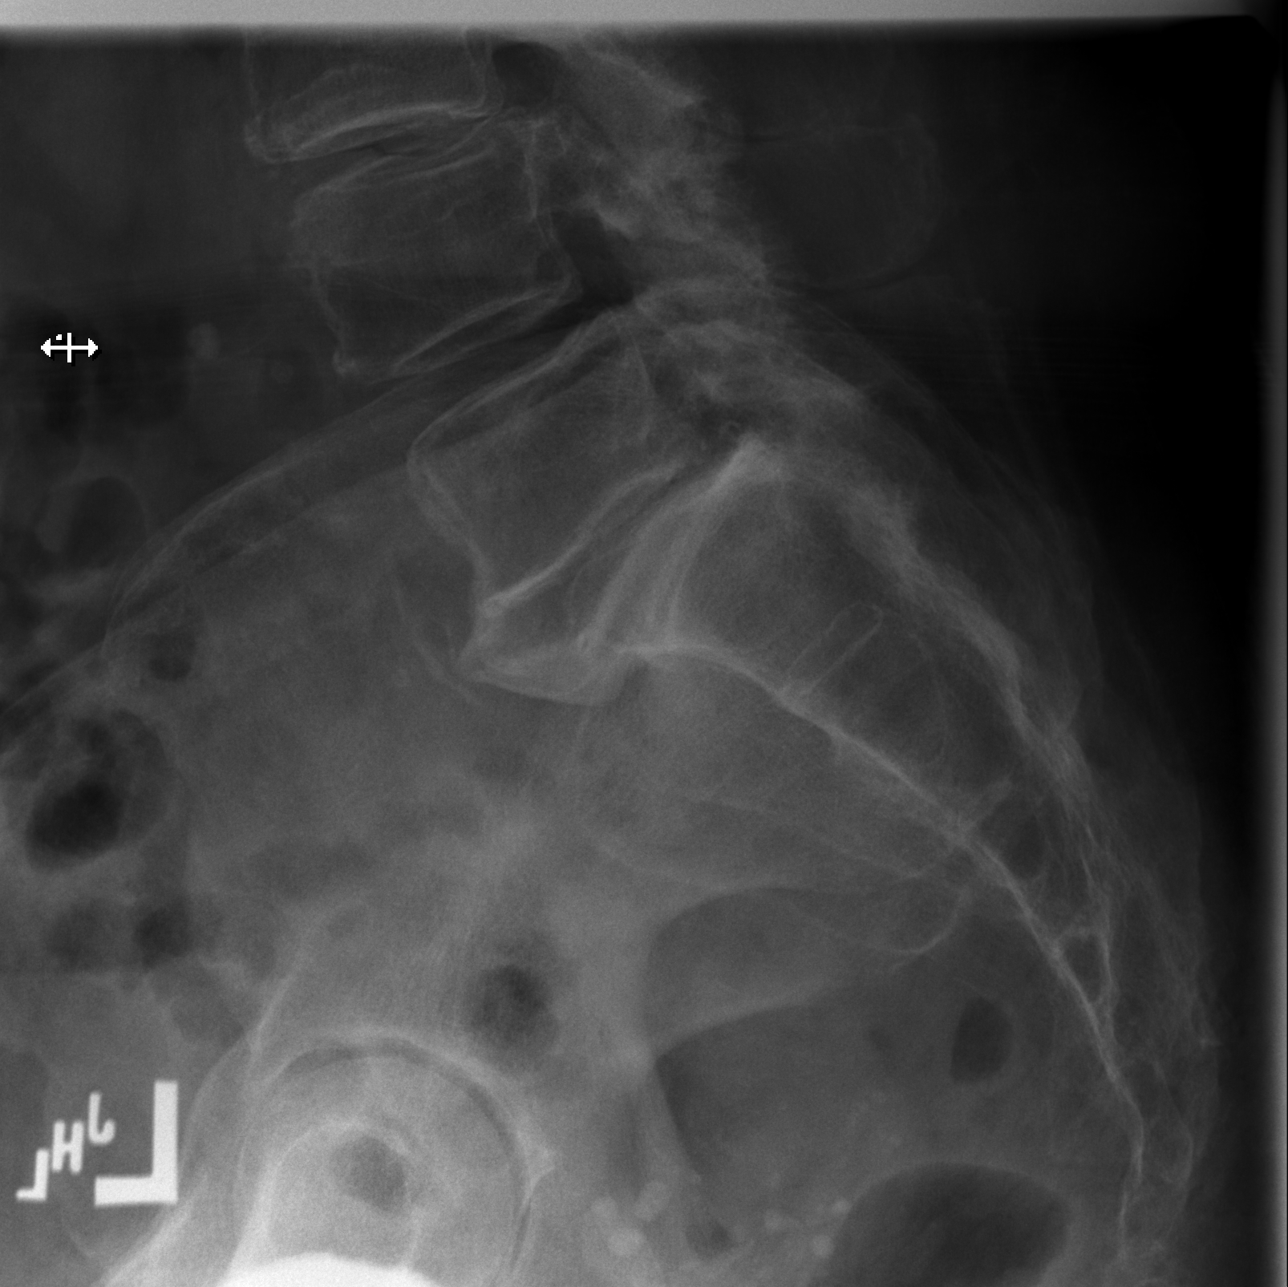

[5 of 5 positions shown; findings below may reference images not displayed]

FINDINGS: Age indeterminate, likely chronic fracture of superior endplate of
L1 with mild anterior wedging. Mild 80 determinate, chronic
appearing fracture of inferior endplate of L3. No definite acute
fracture. Multilevel degenerative changes with endplate irregularity
and disc space narrowing primarily at L5-S1 and L3-L4. There is
grade 1 L3-L4 anterolisthesis. Multilevel facet arthropathy. The
visualized posterior elements appear intact. The soft tissues are
unremarkable. Partially visualized right hip arthroplasty.
IMPRESSION: 1. Age indeterminate, likely chronic, mild compression fractures of
L1 and L3.
2. Multilevel degenerative changes of the lumbar spine.
